# Patient Record
Sex: Female | Born: 1955 | Race: Black or African American | Hispanic: No | Marital: Married | Smoking: Current every day smoker
Health system: Southern US, Community
[De-identification: ages and names within clinical notes are randomized; demographics above are authoritative.]

## PROBLEM LIST (undated history)

## (undated) DIAGNOSIS — I10 Essential (primary) hypertension: Secondary | ICD-10-CM

## (undated) DIAGNOSIS — E119 Type 2 diabetes mellitus without complications: Secondary | ICD-10-CM

## (undated) DIAGNOSIS — E785 Hyperlipidemia, unspecified: Secondary | ICD-10-CM

## (undated) HISTORY — DX: Essential (primary) hypertension: I10

## (undated) HISTORY — DX: Hyperlipidemia, unspecified: E78.5

## (undated) HISTORY — DX: Type 2 diabetes mellitus without complications: E11.9

---

## 2005-08-26 ENCOUNTER — Emergency Department (HOSPITAL_COMMUNITY): Admission: EM | Admit: 2005-08-26 | Discharge: 2005-08-27 | Payer: Self-pay | Admitting: Emergency Medicine

## 2005-08-29 ENCOUNTER — Inpatient Hospital Stay (HOSPITAL_COMMUNITY): Admission: AD | Admit: 2005-08-29 | Discharge: 2005-08-31 | Payer: Self-pay | Admitting: Pulmonary Disease

## 2008-08-30 ENCOUNTER — Emergency Department (HOSPITAL_COMMUNITY): Admission: EM | Admit: 2008-08-30 | Discharge: 2008-08-30 | Payer: Self-pay | Admitting: Emergency Medicine

## 2009-01-27 ENCOUNTER — Emergency Department (HOSPITAL_COMMUNITY): Admission: EM | Admit: 2009-01-27 | Discharge: 2009-01-27 | Payer: Self-pay | Admitting: Emergency Medicine

## 2010-02-13 ENCOUNTER — Emergency Department (HOSPITAL_COMMUNITY)
Admission: EM | Admit: 2010-02-13 | Discharge: 2010-02-13 | Payer: Self-pay | Source: Home / Self Care | Admitting: Emergency Medicine

## 2010-08-03 NOTE — Discharge Summary (Signed)
NAME:  Anna Hale, Anna Hale           ACCOUNT NO.:  [id number]   MEDICAL RECORD NO.:  [id number]          PATIENT TYPE:  INP   LOCATION:  A303                          FACILITY:  [facility]   PHYSICIAN:  Edward L. Juanetta Gosling, M.D.DATE OF BIRTH:  01/22/1956   DATE OF ADMISSION:  08/29/2005  DATE OF DISCHARGE:  06/16/2007LH                                 DISCHARGE SUMMARY   FINAL DISCHARGE DIAGNOSIS:  Escherichia coli pyelonephritis.   [name] is a 55 year old who came to my office because of flank pain and  fever.  She was in her usual state of good health at home when this  occurred.  She had actually been to the emergency room, was diagnosed with  an upper respiratory infection, and was discharged on Zithromax.  Since then  she has had a lot of flank pain.  She is still febrile. She is still having  a lot of weakness and flank pain.  She has not really got a lot of urinary  symptoms.  Her urine very positive and was very positive in the emergency  room.  However, as mentioned, it was felt at that time that she probably had  an upper respiratory infection and she was discharged on Zithromax.  When  she was seen in my office, she was sweaty, her temperature was 102.  Her  chest was clear.  She had marked right flank pain suggesting pyelonephritis.   HOSPITAL COURSE:  She was started on IV antibiotics and showed improvement  rapidly and she was discharged home on June 16 on Levaquin 500 mg daily x7  more days.  She is going to follow up in my office.      Edward L. Juanetta Gosling, M.D.  Electronically Signed     ELH/MEDQ  D:  08/31/2005  T:  08/31/2005  Job:  [id number]

## 2010-08-03 NOTE — H&P (Signed)
NAME:  Anna Hale, Anna Hale           ACCOUNT NO.:  [id number]   MEDICAL RECORD NO.:  [id number]          PATIENT TYPE:  INP   LOCATION:  A303                          FACILITY:  [facility]   PHYSICIAN:  Edward L. Juanetta Gosling, M.D.DATE OF BIRTH:  Jul 25, 1955   DATE OF ADMISSION:  08/29/2005  DATE OF DISCHARGE:  LH                                HISTORY & PHYSICAL   Ms. Roesler is a 55 year old, whom I have not seen before, but I see her  father.  She had been in her usual state of good health at home, on no  medications, with no known medical problem when she developed fever.  She  said that she is having some pain in her side.  She became febrile and  actually went to the emergency room on the 12th.  She was diagnosed as  having bronchitis, placed on Zithromax and started her on that but has not  improved.  She says she has noted that she is having a lot more pain in the  right side.  She is having a lot of discomfort.  She is having sweats.  She  has had fever to about 102.  She denies any urinary symptom.  She is not  having a lot of chest symptoms; she mostly has the pain.  She has had some  chills.  She has had, as mentioned, no urinary symptoms.   PAST MEDICAL HISTORY:  Essentially negative.   MEDICATIONS:   ALLERGIES:  COMPAZINE.   She has never had a similar illness in the past.   SOCIAL HISTORY:  She smokes.  She does not drink any alcohol.  She does not  use any other drugs.  She actually works in the school system.  She has been  providing care for her father who has multiple chronic medical illnesses.  She is on no chronic medications.  She is allergic to Compazine which causes  her to develop extra pyramidal symptoms.   REVIEW OF SYSTEMS:  Except as mentioned, she has had no sore throat.  She  has had fever and chills.  She has had no cough.  She has not had any  symptoms of ear pain, etc.  Her exam in my office showed that she was  sweating and appeared to be in acute  distress.  Her blood pressure was  120/70, pulse was 100.  Temperature was actually was only in the 90s, but  she said it had been much higher earlier.  Her pupils were reactive.  Her  extraocular muscles were intact.  Tympanic membranes were clear.  Her throat  was clear without any inflammatory changes.  She did not have any  adenopathy.  Her chest shows some decreased breath sounds on the right.  The  left was clear __________regular without gallop.  Abdomen is soft, no masses  felt.  Extremities showed no edema.  She had marked disk percussion  tenderness in the left flank.   Laboratory work from the emergency room showed that her urine was very  positive, and I think she probably has pyelonephritis based on her exam,  clinical history,  and urinalysis.  So I am going to ahead and admit her to  the hospital and treat as if this pyelonephritis with Levaquin.  Otherwise,  I am going to stop the Z-Pak.  I do not think it is helping her.  She is  going to have __________standing orders, and she will follow from there.      Edward L. Juanetta Gosling, M.D.  Electronically Signed     ELH/MEDQ  D:  08/29/2005  T:  08/29/2005  Job:  [id number]

## 2010-08-03 NOTE — Group Therapy Note (Signed)
NAME:  Anna Hale, Anna Hale           ACCOUNT NO.:  [id number]   MEDICAL RECORD NO.:  [id number]          PATIENT TYPE:  INP   LOCATION:  A303                          FACILITY:  [facility]   PHYSICIAN:  Edward L. Juanetta Gosling, M.D.DATE OF BIRTH:  Jun 06, 1955   DATE OF PROCEDURE:  08/31/2005  DATE OF DISCHARGE:  08/31/2005                                   PROGRESS NOTE   Ms. Scales says she feels great and wants to go home.  She has no  complaints.   She had been afebrile since yesterday temperature is 98.5, pulse 80,  respirations 22, blood pressure 112/62.  Her flank is much less tender.  Her  chest is clear.  She looks much more comfortable.   ASSESSMENT:  She is better.   PLAN:  To discharge home today.  Please see discharge summary for details.      Edward L. Juanetta Gosling, M.D.  Electronically Signed     ELH/MEDQ  D:  08/31/2005  T:  08/31/2005  Job:  [id number]

## 2010-08-03 NOTE — Group Therapy Note (Signed)
NAME:  Anna Hale, Anna Hale           ACCOUNT NO.:  [id number]   MEDICAL RECORD NO.:  [id number]          PATIENT TYPE:  INP   LOCATION:  A303                          FACILITY:  [facility]   PHYSICIAN:  Edward L. Juanetta Gosling, M.D.DATE OF BIRTH:  [birth year]   DATE OF PROCEDURE:  08/30/2005  DATE OF DISCHARGE:  08/31/2005                                   PROGRESS NOTE   Ms. Wussow says she feels better, but still does not feel well.  She is  still quite painful in her flank; and she says she just feels washed out.  She has no other new complaints.   OBJECTIVE:  Her exam shows her temperature was as high as 102.5; today it is  100.4.  Blood pressure 111/58, respirations 20, pulse 86.   ASSESSMENT:  She has got pyelonephritis.   PLAN:  Is to continue her IV antibiotics and reevaluate tomorrow.      Edward L. Juanetta Gosling, M.D.  Electronically Signed     ELH/MEDQ  D:  08/31/2005  T:  08/31/2005  Job:  [id number]

## 2011-05-04 ENCOUNTER — Emergency Department (HOSPITAL_COMMUNITY)
Admission: EM | Admit: 2011-05-04 | Discharge: 2011-05-04 | Disposition: A | Discharge status: Home / Self Care | Payer: BC Managed Care – PPO | Attending: Emergency Medicine | Admitting: Emergency Medicine

## 2011-05-04 ENCOUNTER — Encounter (HOSPITAL_COMMUNITY): Payer: Self-pay | Admitting: *Deleted

## 2011-05-04 DIAGNOSIS — J329 Chronic sinusitis, unspecified: Secondary | ICD-10-CM | POA: Insufficient documentation

## 2011-05-04 DIAGNOSIS — F17200 Nicotine dependence, unspecified, uncomplicated: Secondary | ICD-10-CM | POA: Insufficient documentation

## 2011-05-04 DIAGNOSIS — R220 Localized swelling, mass and lump, head: Secondary | ICD-10-CM | POA: Insufficient documentation

## 2011-05-04 DIAGNOSIS — R05 Cough: Secondary | ICD-10-CM

## 2011-05-04 DIAGNOSIS — R070 Pain in throat: Secondary | ICD-10-CM | POA: Insufficient documentation

## 2011-05-04 DIAGNOSIS — R6889 Other general symptoms and signs: Secondary | ICD-10-CM | POA: Insufficient documentation

## 2011-05-04 DIAGNOSIS — R51 Headache: Secondary | ICD-10-CM | POA: Insufficient documentation

## 2011-05-04 DIAGNOSIS — J3489 Other specified disorders of nose and nasal sinuses: Secondary | ICD-10-CM | POA: Insufficient documentation

## 2011-05-04 DIAGNOSIS — R059 Cough, unspecified: Secondary | ICD-10-CM | POA: Insufficient documentation

## 2011-05-04 MED ORDER — AZITHROMYCIN 250 MG PO TABS: ORAL_TABLET | ORAL | Status: DC

## 2011-05-04 MED ORDER — HYDROCOD POLST-CHLORPHEN POLST 10-8 MG/5ML PO LQCR: 5.0000 mL | Freq: Two times a day (BID) | ORAL | Status: DC | PRN

## 2011-05-04 NOTE — Discharge Instructions (Signed)

## 2011-05-04 NOTE — ED Notes (Signed)
Pt c/o nasal congestion and headache since Thursday. Pt states that her drainage is clear.

## 2011-05-04 NOTE — ED Notes (Signed)
Pt a/ox4. resp even and unlabored. NAD at this time. D/C instructions reviewed with pt. Pt verbalized understanding. Pt ambulated to lobby with steady gate.  

## 2011-05-04 NOTE — ED Notes (Signed)
Pt presents with cough and nasal congestion since Thursday. NAD at this time.

## 2011-05-04 NOTE — ED Provider Notes (Signed)
History     CSN: [id number]  Arrival date & time 05/04/11  1205   First MD Initiated Contact with Patient 05/04/11 1229      Chief Complaint  Patient presents with  . Nasal Congestion    (Consider location/radiation/quality/duration/timing/severity/associated sxs/prior treatment) HPI Comments: Patient complains of nasal congestion, runny nose, cough, and headache for 2 days. She also complains of sinus pressure. She denies any chest pain, shortness of breath, fever, or vomiting.  She states that her cough is occasionally productive of yellow sputum but mostly clear.  She states she has taken over-the-counter cold medications without improvement.  Patient is a 56 y.o. female presenting with URI. The history is provided by the patient. No language interpreter was used.  URI The primary symptoms include headaches, sore throat and cough. Primary symptoms do not include fever, fatigue, ear pain, swollen glands, wheezing, abdominal pain, nausea, vomiting, myalgias, arthralgias or rash. The current episode started 2 days ago. This is a new problem. The problem has not changed since onset. The headache is not associated with neck stiffness or weakness.   The sore throat is not accompanied by trouble swallowing.  The cough began 2 days ago. The cough is new. The cough is productive. The sputum is white.  Symptoms associated with the illness include sinus pressure, congestion and rhinorrhea. The illness is not associated with chills or facial pain.    History reviewed. No pertinent past medical history.  Past Surgical History  Procedure Date  . Cesarean section     History reviewed. No pertinent family history.  History  Substance Use Topics  . Smoking status: Current Everyday Smoker -- 1.0 packs/day    Types: Cigarettes  . Smokeless tobacco: Not on file  . Alcohol Use: No    OB History    Grav Para Term Preterm Abortions TAB SAB Ect Mult Living                  Review of  Systems  Constitutional: Negative for fever, chills, activity change, appetite change and fatigue.  HENT: Positive for congestion, sore throat, rhinorrhea, sneezing and sinus pressure. Negative for ear pain, trouble swallowing, neck pain and neck stiffness.   Respiratory: Positive for cough. Negative for chest tightness, shortness of breath and wheezing.   Cardiovascular: Negative for chest pain and palpitations.  Gastrointestinal: Negative for nausea, vomiting, abdominal pain and blood in stool.  Genitourinary: Negative for dysuria, hematuria, flank pain and difficulty urinating.  Musculoskeletal: Negative for myalgias, back pain and arthralgias.  Skin: Negative for rash.  Neurological: Positive for headaches. Negative for dizziness, weakness and numbness.  Hematological: Does not bruise/bleed easily.  All other systems reviewed and are negative.    Allergies  Compazine  Home Medications  No current outpatient prescriptions on file.  BP 142/79  Pulse 82  Temp(Src) 98.7 F (37.1 C) (Oral)  Resp 18  Ht 5\' 5"  (1.651 m)  Wt 206 lb 1 oz (93.469 kg)  BMI 34.29 kg/m2  SpO2 98%  Physical Exam  Nursing note and vitals reviewed. Constitutional: She is oriented to person, place, and time. She appears well-developed and well-nourished. No distress.  HENT:  Head: Normocephalic and atraumatic.  Right Ear: Tympanic membrane and ear canal normal.  Left Ear: Tympanic membrane and ear canal normal.  Nose: Mucosal edema and rhinorrhea present. Right sinus exhibits maxillary sinus tenderness and frontal sinus tenderness. Left sinus exhibits maxillary sinus tenderness and frontal sinus tenderness.  Mouth/Throat: Uvula is midline, oropharynx is  clear and moist and mucous membranes are normal.  Eyes: EOM are normal. Pupils are equal, round, and reactive to light.  Neck: Normal range of motion. Neck supple.  Cardiovascular: Normal rate, regular rhythm and normal heart sounds.   Pulmonary/Chest:  Effort normal and breath sounds normal. No respiratory distress. She has no wheezes. She has no rales. She exhibits no tenderness.  Abdominal: Soft. She exhibits no distension. There is no tenderness.  Musculoskeletal: Normal range of motion. She exhibits no tenderness.  Lymphadenopathy:    She has no cervical adenopathy.  Neurological: She is alert and oriented to person, place, and time. No cranial nerve deficit. She exhibits normal muscle tone. Coordination normal.  Skin: Skin is warm and dry.    ED Course  Procedures (including critical care time)       MDM    Patient is alert no acute distress. Vitals are stable.  Non-toxic appearing, Lung sounds are clear auscultation bilaterally.  Mild to moderate nasal congestion and mucosal edema.  Tenderness to palpation of the bilateral frontal sinuses.    Patient / Family / Caregiver understand and agree with initial ED impression and plan with expectations set for ED visit. Pt feels improved after observation and/or treatment in ED.   Glenard Keesling L. [place], [place] 05/05/11 [phone]

## 2011-05-07 NOTE — ED Provider Notes (Signed)
Medical screening examination/treatment/procedure(s) were performed by non-physician practitioner and as supervising physician I was immediately available for consultation/collaboration.  Nicoletta Dress. Colon Branch, MD 05/07/11 1530

## 2012-10-08 ENCOUNTER — Encounter (HOSPITAL_COMMUNITY): Payer: Self-pay

## 2012-10-08 ENCOUNTER — Emergency Department (HOSPITAL_COMMUNITY)
Admission: EM | Admit: 2012-10-08 | Discharge: 2012-10-08 | Disposition: A | Discharge status: Home / Self Care | Payer: BC Managed Care – PPO | Attending: Emergency Medicine | Admitting: Emergency Medicine

## 2012-10-08 DIAGNOSIS — S4980XA Other specified injuries of shoulder and upper arm, unspecified arm, initial encounter: Secondary | ICD-10-CM | POA: Insufficient documentation

## 2012-10-08 DIAGNOSIS — Y92410 Unspecified street and highway as the place of occurrence of the external cause: Secondary | ICD-10-CM | POA: Insufficient documentation

## 2012-10-08 DIAGNOSIS — S99929A Unspecified injury of unspecified foot, initial encounter: Secondary | ICD-10-CM | POA: Insufficient documentation

## 2012-10-08 DIAGNOSIS — S46909A Unspecified injury of unspecified muscle, fascia and tendon at shoulder and upper arm level, unspecified arm, initial encounter: Secondary | ICD-10-CM | POA: Insufficient documentation

## 2012-10-08 DIAGNOSIS — IMO0002 Reserved for concepts with insufficient information to code with codable children: Secondary | ICD-10-CM | POA: Insufficient documentation

## 2012-10-08 DIAGNOSIS — S8990XA Unspecified injury of unspecified lower leg, initial encounter: Secondary | ICD-10-CM | POA: Insufficient documentation

## 2012-10-08 DIAGNOSIS — Y9389 Activity, other specified: Secondary | ICD-10-CM | POA: Insufficient documentation

## 2012-10-08 DIAGNOSIS — F17200 Nicotine dependence, unspecified, uncomplicated: Secondary | ICD-10-CM | POA: Insufficient documentation
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
## 2012-10-08 NOTE — ED Notes (Signed)
nad noted prior to dc dc instructions reviewed with pt and explained and voiced understanding.
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
## 2014-02-20 ENCOUNTER — Encounter (HOSPITAL_COMMUNITY): Payer: Self-pay | Admitting: Emergency Medicine

## 2014-02-20 ENCOUNTER — Emergency Department (HOSPITAL_COMMUNITY)
Admission: EM | Admit: 2014-02-20 | Discharge: 2014-02-20 | Disposition: A | Discharge status: Home / Self Care | Payer: BC Managed Care – PPO | Attending: Emergency Medicine | Admitting: Emergency Medicine

## 2014-02-20 DIAGNOSIS — J039 Acute tonsillitis, unspecified: Secondary | ICD-10-CM | POA: Insufficient documentation

## 2014-02-20 DIAGNOSIS — Z720 Tobacco use: Secondary | ICD-10-CM | POA: Insufficient documentation

## 2014-02-20 DIAGNOSIS — J029 Acute pharyngitis, unspecified: Secondary | ICD-10-CM | POA: Diagnosis present

## 2014-02-20 LAB — RAPID STREP SCREEN (MED CTR MEBANE ONLY): Streptococcus, Group A Screen (Direct): NEGATIVE

## 2014-02-20 MED ORDER — AMOXICILLIN-POT CLAVULANATE 875-125 MG PO TABS: 1.0000 | ORAL_TABLET | Freq: Two times a day (BID) | ORAL | Status: DC

## 2014-02-20 MED ORDER — AMOXICILLIN-POT CLAVULANATE 875-125 MG PO TABS
1.0000 | ORAL_TABLET | Freq: Two times a day (BID) | ORAL | Status: DC
  Administered 2014-02-20: 1 via ORAL
  Filled 2014-02-20: qty 1

## 2014-02-20 NOTE — Progress Notes (Signed)
ED/CM noted patient did not have health insurance and/or PCP listed in the computer.  Patient was given the [facility] resources handout with information on the clinics, food pantries, and the handout for new health insurance sign-up. Provided drug discount card. Patient expressed appreciation for information received.

## 2014-02-20 NOTE — ED Provider Notes (Signed)
CSN: [id number]     Arrival date & time 02/20/14  1207 History  This chart was scribed for Nat Christen, MD by Jeanell Sparrow, ED Scribe. This patient was seen in room APA05/APA05 and the patient's care was started at 1:54 PM.    Chief Complaint  Patient presents with  . Sore Throat   The history is provided by the patient. No language interpreter was used.   HPI Comments: Anna Hale is a 58 y.o. female who presents to the Emergency Department complaining of a sore throat that started 2 days ago. She reports that she had some sinus pressure also. She reports no treatment PTA. She denies any cough, stiff neck.  Severity is moderate. She is able to swallow.   History reviewed. No pertinent past medical history. Past Surgical History  Procedure Laterality Date  . Cesarean section     History reviewed. No pertinent family history. History  Substance Use Topics  . Smoking status: Current Every Day Smoker -- 1.00 packs/day    Types: Cigarettes  . Smokeless tobacco: Not on file  . Alcohol Use: No   OB History    No data available     Review of Systems  Constitutional: Negative for fever.  HENT: Positive for sinus pressure and sore throat.   All other systems reviewed and are negative.   Allergies  Compazine  Home Medications   Prior to Admission medications   Medication Sig Start Date End Date Taking? Authorizing Provider  amoxicillin-clavulanate (AUGMENTIN) 875-125 MG per tablet Take 1 tablet by mouth 2 (two) times daily. One po bid x 7 days 02/20/14   Nat Christen, MD  azithromycin (ZITHROMAX Z-PAK) 250 MG tablet Take two tablets on day one, then one tab qd days 2-5 Patient not taking: Reported on 02/20/2014 05/04/11   Tammy L. Triplett, PA-C  chlorpheniramine-HYDROcodone (TUSSIONEX PENNKINETIC ER) 10-8 MG/5ML LQCR Take 5 mLs by mouth every 12 (twelve) hours as needed. Patient not taking: Reported on 02/20/2014 05/04/11   Tammy L. Triplett, PA-C   BP 157/78 mmHg  Pulse 105   Temp(Src) 100.9 F (38.3 C) (Oral)  Resp 18  Ht 5\' 8"  (1.727 m)  Wt 200 lb (90.719 kg)  BMI 30.42 kg/m2  SpO2 100% Physical Exam  Constitutional: She is oriented to person, place, and time. She appears well-developed and well-nourished.  HENT:  Head: Normocephalic and atraumatic.  Some tonsillar erythema and edema. White patches on right tonsil.   Eyes: Conjunctivae and EOM are normal. Pupils are equal, round, and reactive to light.  Neck: Normal range of motion. Neck supple.  Cardiovascular: Normal rate, regular rhythm and normal heart sounds.   Pulmonary/Chest: Effort normal and breath sounds normal.  Abdominal: Soft. Bowel sounds are normal.  Musculoskeletal: Normal range of motion.  Neurological: She is alert and oriented to person, place, and time.  Skin: Skin is warm and dry.  Psychiatric: She has a normal mood and affect. Her behavior is normal.  Nursing note and vitals reviewed.   ED Course  Procedures (including critical care time) DIAGNOSTIC STUDIES: Oxygen Saturation is 100% on RA, normal by my interpretation.    COORDINATION OF CARE: 1:58 PM- Pt advised of plan for treatment which includes Augmentin and a strep test and pt agrees.  Labs Review Labs Reviewed  RAPID STREP SCREEN  CULTURE, GROUP A STREP    Imaging Review No results found.   EKG Interpretation None      MDM   Final diagnoses:  Tonsillitis  No evidence of peritonsillar abscess. However she does have exudative tonsillitis. Rx Augmentin 875/125 twice a day.  I personally performed the services described in this documentation, which was scribed in my presence. The recorded information has been reviewed and is accurate.     Nat Christen, MD 02/20/14 1515

## 2014-02-20 NOTE — ED Notes (Signed)
Pt reports sinus pressure and sore throat since Friday. nad noted. Pt denies any known fever.

## 2014-02-20 NOTE — Discharge Instructions (Signed)
Antibiotic twice a day. Gargle with salt water. Increase fluids. Tylenol or ibuprofen for pain.

## 2014-02-22 LAB — CULTURE, GROUP A STREP

## 2014-02-23 ENCOUNTER — Telehealth (HOSPITAL_COMMUNITY): Payer: Self-pay

## 2014-02-23 NOTE — Telephone Encounter (Signed)
Post ED Visit - Positive Culture Follow-up  Culture report reviewed by antimicrobial stewardship pharmacist: []  Wes Dulaney, Pharm.D., BCPS [x]  Heide Guile, Pharm.D., BCPS []  Alycia Rossetti, Pharm.D., BCPS []  [place], Pharm.D., BCPS, AAHIVP []  Legrand Como, Pharm.D., BCPS, AAHIVP []  Elicia Lamp, Pharm.D.   Positive Throat culture, Group A Strep Treated with Amoxicillin, organism sensitive to the same and no further patient follow-up is required at this time.  Dortha Kern 02/23/2014, 4:49 PM

## 2015-09-07 ENCOUNTER — Emergency Department (HOSPITAL_COMMUNITY)
Admission: EM | Admit: 2015-09-07 | Discharge: 2015-09-07 | Disposition: A | Discharge status: Home / Self Care | Payer: BC Managed Care – PPO | Attending: Emergency Medicine | Admitting: Emergency Medicine

## 2015-09-07 ENCOUNTER — Encounter (HOSPITAL_COMMUNITY): Payer: Self-pay | Admitting: *Deleted

## 2015-09-07 DIAGNOSIS — M545 Low back pain, unspecified: Secondary | ICD-10-CM

## 2015-09-07 DIAGNOSIS — F17210 Nicotine dependence, cigarettes, uncomplicated: Secondary | ICD-10-CM | POA: Diagnosis not present

## 2015-09-07 DIAGNOSIS — I10 Essential (primary) hypertension: Secondary | ICD-10-CM | POA: Diagnosis not present

## 2015-09-07 DIAGNOSIS — R739 Hyperglycemia, unspecified: Secondary | ICD-10-CM | POA: Insufficient documentation

## 2015-09-07 DIAGNOSIS — R7989 Other specified abnormal findings of blood chemistry: Secondary | ICD-10-CM

## 2015-09-07 LAB — I-STAT CHEM 8, ED
BUN: 20 mg/dL (ref 6–20)
CREATININE: 1.2 mg/dL — AB (ref 0.44–1.00)
Calcium, Ion: 1.11 mmol/L — ABNORMAL LOW (ref 1.13–1.30)
Chloride: 106 mmol/L (ref 101–111)
Glucose, Bld: 181 mg/dL — ABNORMAL HIGH (ref 65–99)
HEMATOCRIT: 43 % (ref 36.0–46.0)
HEMOGLOBIN: 14.6 g/dL (ref 12.0–15.0)
POTASSIUM: 4.4 mmol/L (ref 3.5–5.1)
SODIUM: 140 mmol/L (ref 135–145)
TCO2: 26 mmol/L (ref 0–100)

## 2015-09-07 LAB — URINALYSIS, ROUTINE W REFLEX MICROSCOPIC
Bilirubin Urine: NEGATIVE
GLUCOSE, UA: NEGATIVE mg/dL
Ketones, ur: NEGATIVE mg/dL
LEUKOCYTES UA: NEGATIVE
NITRITE: NEGATIVE
PH: 5.5 (ref 5.0–8.0)
Protein, ur: NEGATIVE mg/dL

## 2015-09-07 LAB — URINE MICROSCOPIC-ADD ON

## 2015-09-07 MED ORDER — HYDROCHLOROTHIAZIDE 25 MG PO TABS: 25.0000 mg | ORAL_TABLET | Freq: Every day | ORAL | Status: DC

## 2015-09-07 MED ORDER — TRAMADOL HCL 50 MG PO TABS: 50.0000 mg | ORAL_TABLET | Freq: Four times a day (QID) | ORAL | Status: DC | PRN

## 2015-09-07 NOTE — ED Notes (Signed)
Pt reports lower back pain and states it feels the same way as when she had a uti. Pt denies dysuria and urinary frequency but states "I'm trying to catch it before it gets bad."

## 2015-09-07 NOTE — ED Provider Notes (Signed)
CSN: [id number]     Arrival date & time 09/07/15  2057 History   First MD Initiated Contact with Patient 09/07/15 2210     Chief Complaint  Patient presents with  . Back Pain     (Consider location/radiation/quality/duration/timing/severity/associated sxs/prior Treatment) HPI  This is a 60 year old female who presents emergency Department with chief complaint of left lower back pain. Patient states that she has had several days ago. Then her left lower back which is worse with twisting. Patient was concerned she might be getting a urinary tract infection. She is found to be extremely hypertensive at triage and denies a previous history of this diagnosis. She does not have a current primary care physician. She denies any urinary symptoms, fevers, chills, nausea, vomiting.Denies weakness, loss of bowel/bladder function or saddle anesthesia. Denies neck stiffness, headache, rash.  Denies fever or recent procedures to back.   History reviewed. No pertinent past medical history. Past Surgical History  Procedure Laterality Date  . Cesarean section     No family history on file. Social History  Substance Use Topics  . Smoking status: Current Every Day Smoker -- 1.00 packs/day    Types: Cigarettes  . Smokeless tobacco: None  . Alcohol Use: No   OB History    No data available     Review of Systems Ten systems reviewed and are negative for acute change, except as noted in the HPI.     Allergies  Compazine  Home Medications   Prior to Admission medications   Medication Sig Start Date End Date Taking? Authorizing Provider  amoxicillin-clavulanate (AUGMENTIN) 875-125 MG per tablet Take 1 tablet by mouth 2 (two) times daily. One po bid x 7 days 02/20/14   Nat Christen, MD  azithromycin (ZITHROMAX Z-PAK) 250 MG tablet Take two tablets on day one, then one tab qd days 2-5 Patient not taking: Reported on 02/20/2014 05/04/11   Tammy Triplett, PA-C  chlorpheniramine-HYDROcodone (TUSSIONEX  PENNKINETIC ER) 10-8 MG/5ML LQCR Take 5 mLs by mouth every 12 (twelve) hours as needed. Patient not taking: Reported on 02/20/2014 05/04/11   Tammy Triplett, PA-C   BP 229/91 mmHg  Pulse 92  Temp(Src) 98.8 F (37.1 C) (Oral)  Resp 20  Ht 5\' 6"  (1.676 m)  Wt 97.297 kg  BMI 34.64 kg/m2  SpO2 100% Physical Exam  Constitutional: She is oriented to person, place, and time. She appears well-developed and well-nourished. No distress.  HENT:  Head: Normocephalic and atraumatic.  Eyes: Conjunctivae are normal. No scleral icterus.  Neck: Normal range of motion.  Cardiovascular: Normal rate, regular rhythm and normal heart sounds.  Exam reveals no gallop and no friction rub.   No murmur heard. Pulmonary/Chest: Effort normal and breath sounds normal. No respiratory distress.  Abdominal: Soft. Bowel sounds are normal. She exhibits no distension and no mass. There is no tenderness. There is no guarding.  Musculoskeletal: Normal range of motion.       Lumbar back: She exhibits tenderness and spasm.       Back:  Neurological: She is alert and oriented to person, place, and time.  Skin: Skin is warm and dry. She is not diaphoretic.  Nursing note and vitals reviewed.   ED Course  Procedures (including critical care time) Labs Review Labs Reviewed  URINALYSIS, ROUTINE W REFLEX MICROSCOPIC (NOT AT [facility]) - Abnormal; Notable for the following:    Specific Gravity, Urine >1.030 (*)    Hgb urine dipstick SMALL (*)    All other components within  normal limits  URINE MICROSCOPIC-ADD ON - Abnormal; Notable for the following:    Squamous Epithelial / LPF 6-30 (*)    Bacteria, UA RARE (*)    All other components within normal limits    Imaging Review No results found. I have personally reviewed and evaluated these images and lab results as part of my medical decision-making.   EKG Interpretation None      MDM   Final diagnoses:  Left-sided low back pain without sciatica  Essential  hypertension  Hyperglycemia  Elevated serum creatinine    Review of chart shows  BP 212/90 back in 2014. She was clearly had hypertension for some time. Her urine is negative. Creatinine is 1.2 which is slightly elevated, but given the long duration of her hypertension. I suspect this is her normal. Advised patient not to take any NSAIDs. Patient will be given tramadol for pain. She will also be started on a single agent of HCTZ 25 mg and is advised to find a primary care physician immediately. Given the fact that she has poorly controlled and very high, hypertension, elevated serum creatinine and hyperglycemia. Patient is also advised to quit smoking. Discussed return precautions with the patient.Patient with back pain.  No neurological deficits and normal neuro exam.  Patient can walk but states is painful.  No loss of bowel or bladder control.  No concern for cauda equina.  No fever, night sweats, weight loss, h/o cancer, IVDU.  RICE protocol and pain medicine indicated and discussed with patient.      Margarita Mail, PA-C 09/08/15 0131  Dorie Rank, MD 09/11/15 1058

## 2015-09-07 NOTE — Discharge Instructions (Signed)
Your blood pressure is TOO HIGH and needs to be managed by a primary care doctor right away. Your kidneys appear to be affected by the blood pressure already and over time this can lead to kidney disease or failure. Your creatine (shich measures kidney functions) was elevated and needs to be rechecked in a week or so. Your blood sugar is also too high. NONE of this appears to be related to your back pain.  I am starting you on HCTZ for your blood pressure. THis is only a small start and is not enough to manage your blood pressure. You will need to take blood pressure medicine every month and will likely need more than one medicine. This is only a bandaid for the time being and you will need further management with a primary care doctor.  IF YOU DO NOT HAVE A PRIMARY CARE DOCTOR YOU NEED TO HAVE ONE RIGHT AWAY!!!!  SEEK IMMEDIATE MEDICAL ATTENTION IF: New numbness, tingling, weakness, or problem with the use of your arms or legs.  Severe back pain not relieved with medications.  Change in bowel or bladder control.  Increasing pain in any areas of the body (such as chest or abdominal pain).  Shortness of breath, dizziness or fainting.  Nausea (feeling sick to your stomach), vomiting, fever, or sweats.   Managing Your High Blood Pressure Blood pressure is a measurement of how forceful your blood is pressing against the walls of the arteries. Arteries are muscular tubes within the circulatory system. Blood pressure does not stay the same. Blood pressure rises when you are active, excited, or nervous; and it lowers during sleep and relaxation. If the numbers measuring your blood pressure stay above normal most of the time, you are at risk for health problems. High blood pressure (hypertension) is a long-term (chronic) condition in which blood pressure is elevated. A blood pressure reading is recorded as two numbers, such as 120 over 80 (or 120/80). The first, higher number is called the systolic  pressure. It is a measure of the pressure in your arteries as the heart beats. The second, lower number is called the diastolic pressure. It is a measure of the pressure in your arteries as the heart relaxes between beats.  Keeping your blood pressure in a normal range is important to your overall health and prevention of health problems, such as heart disease and stroke. When your blood pressure is uncontrolled, your heart has to work harder than normal. High blood pressure is a very common condition in adults because blood pressure tends to rise with age. Men and women are equally likely to have hypertension but at different times in life. Before age 48, men are more likely to have hypertension. After 60 years of age, women are more likely to have it. Hypertension is especially common in African Americans. This condition often has no signs or symptoms. The cause of the condition is usually not known. Your caregiver can help you come up with a plan to keep your blood pressure in a normal, healthy range. BLOOD PRESSURE STAGES Blood pressure is classified into four stages: normal, prehypertension, stage 1, and stage 2. Your blood pressure reading will be used to determine what type of treatment, if any, is necessary. Appropriate treatment options are tied to these four stages:  Normal  Systolic pressure (mm Hg): below 120.  Diastolic pressure (mm Hg): below 80. Prehypertension  Systolic pressure (mm Hg): 120 to 139.  Diastolic pressure (mm Hg): 80 to 89. Stage1  Systolic pressure (mm Hg): 140 to 159.  Diastolic pressure (mm Hg): 90 to 99. Stage2  Systolic pressure (mm Hg): 160 or above.  Diastolic pressure (mm Hg): 100 or above. RISKS RELATED TO HIGH BLOOD PRESSURE Managing your blood pressure is an important responsibility. Uncontrolled high blood pressure can lead to:  A heart attack.  A stroke.  A weakened blood vessel (aneurysm).  Heart failure.  Kidney damage.  Eye  damage.  Metabolic syndrome.  Memory and concentration problems. HOW TO MANAGE YOUR BLOOD PRESSURE Blood pressure can be managed effectively with lifestyle changes and medicines (if needed). Your caregiver will help you come up with a plan to bring your blood pressure within a normal range. Your plan should include the following: Education  Read all information provided by your caregivers about how to control blood pressure.  Educate yourself on the latest guidelines and treatment recommendations. New research is always being done to further define the risks and treatments for high blood pressure. Lifestylechanges  Control your weight.  Avoid smoking.  Stay physically active.  Reduce the amount of salt in your diet.  Reduce stress.  Control any chronic conditions, such as high cholesterol or diabetes.  Reduce your alcohol intake. Medicines  Several medicines (antihypertensive medicines) are available, if needed, to bring blood pressure within a normal range. Communication  Review all the medicines you take with your caregiver because there may be side effects or interactions.  Talk with your caregiver about your diet, exercise habits, and other lifestyle factors that may be contributing to high blood pressure.  See your caregiver regularly. Your caregiver can help you create and adjust your plan for managing high blood pressure. RECOMMENDATIONS FOR TREATMENT AND FOLLOW-UP  The following recommendations are based on current guidelines for managing high blood pressure in nonpregnant adults. Use these recommendations to identify the proper follow-up period or treatment option based on your blood pressure reading. You can discuss these options with your caregiver.  Systolic pressure of 123456 to [id number] or diastolic pressure of 80 to 89: Follow up with your caregiver as directed.  Systolic pressure of [id number] to 0000000 or diastolic pressure of 90 to 100: Follow up with your caregiver within  2 months.  Systolic pressure above 0000000 or diastolic pressure above [id number]: Follow up with your caregiver within 1 month.  Systolic pressure above [id number] or diastolic pressure above [id number]: Consider antihypertensive therapy; follow up with your caregiver within 1 week.  Systolic pressure above [id number] or diastolic pressure above 123456: Begin antihypertensive therapy; follow up with your caregiver within 1 week.   This information is not intended to replace advice given to you by your health care provider. Make sure you discuss any questions you have with your health care provider.   Document Released: 11/27/2011 Document Reviewed: 11/27/2011 Elsevier Interactive Patient Education 2016 [employer].  Hypertension Hypertension, commonly called high blood pressure, is when the force of blood pumping through your arteries is too strong. Your arteries are the blood vessels that carry blood from your heart throughout your body. A blood pressure reading consists of a higher number over a lower number, such as 110/72. The higher number (systolic) is the pressure inside your arteries when your heart pumps. The lower number (diastolic) is the pressure inside your arteries when your heart relaxes. Ideally you want your blood pressure below 120/80. Hypertension forces your heart to work harder to pump blood. Your arteries may become narrow or stiff. Having untreated or uncontrolled hypertension can cause heart  attack, stroke, kidney disease, and other problems. RISK FACTORS Some risk factors for high blood pressure are controllable. Others are not.  Risk factors you cannot control include:   Race. You may be at higher risk if you are African American.  Age. Risk increases with age.  Gender. Men are at higher risk than women before age 54 years. After age 15, women are at higher risk than men. Risk factors you can control include:  Not getting enough exercise or physical activity.  Being overweight.  Getting too  much fat, sugar, calories, or salt in your diet.  Drinking too much alcohol. SIGNS AND SYMPTOMS Hypertension does not usually cause signs or symptoms. Extremely high blood pressure (hypertensive crisis) may cause headache, anxiety, shortness of breath, and nosebleed. DIAGNOSIS To check if you have hypertension, your health care provider will measure your blood pressure while you are seated, with your arm held at the level of your heart. It should be measured at least twice using the same arm. Certain conditions can cause a difference in blood pressure between your right and left arms. A blood pressure reading that is higher than normal on one occasion does not mean that you need treatment. If it is not clear whether you have high blood pressure, you may be asked to return on a different day to have your blood pressure checked again. Or, you may be asked to monitor your blood pressure at home for 1 or more weeks. TREATMENT Treating high blood pressure includes making lifestyle changes and possibly taking medicine. Living a healthy lifestyle can help lower high blood pressure. You may need to change some of your habits. Lifestyle changes may include:  Following the DASH diet. This diet is high in fruits, vegetables, and whole grains. It is low in salt, red meat, and added sugars.  Keep your sodium intake below 2,300 mg per day.  Getting at least 30-45 minutes of aerobic exercise at least 4 times per week.  Losing weight if necessary.  Not smoking.  Limiting alcoholic beverages.  Learning ways to reduce stress. Your health care provider may prescribe medicine if lifestyle changes are not enough to get your blood pressure under control, and if one of the following is true:  You are 67-14 years of age and your systolic blood pressure is above 140.  You are 68 years of age or older, and your systolic blood pressure is above 150.  Your diastolic blood pressure is above 90.  You have  diabetes, and your systolic blood pressure is over [id number] or your diastolic blood pressure is over 90.  You have kidney disease and your blood pressure is above 140/90.  You have heart disease and your blood pressure is above 140/90. Your personal target blood pressure may vary depending on your medical conditions, your age, and other factors. HOME CARE INSTRUCTIONS  Have your blood pressure rechecked as directed by your health care provider.   Take medicines only as directed by your health care provider. Follow the directions carefully. Blood pressure medicines must be taken as prescribed. The medicine does not work as well when you skip doses. Skipping doses also puts you at risk for problems.  Do not smoke.   Monitor your blood pressure at home as directed by your health care provider. SEEK MEDICAL CARE IF:   You think you are having a reaction to medicines taken.  You have recurrent headaches or feel dizzy.  You have swelling in your ankles.  You have trouble  with your vision. SEEK IMMEDIATE MEDICAL CARE IF:  You develop a severe headache or confusion.  You have unusual weakness, numbness, or feel faint.  You have severe chest or abdominal pain.  You vomit repeatedly.  You have trouble breathing. MAKE SURE YOU:   Understand these instructions.  Will watch your condition.  Will get help right away if you are not doing well or get worse.   This information is not intended to replace advice given to you by your health care provider. Make sure you discuss any questions you have with your health care provider.   Document Released: 03/04/2005 Document Revised: 07/19/2014 Document Reviewed: 12/25/2012 Elsevier Interactive Patient Education 2016 [place].  Hyperglycemia Hyperglycemia occurs when the glucose (sugar) in your blood is too high. Hyperglycemia can happen for many reasons, but it most often happens to people who do not know they have diabetes or are not  managing their diabetes properly.  CAUSES  Whether you have diabetes or not, there are other causes of hyperglycemia. Hyperglycemia can occur when you have diabetes, but it can also occur in other situations that you might not be as aware of, such as: Diabetes  If you have diabetes and are having problems controlling your blood glucose, hyperglycemia could occur because of some of the following reasons:  Not following your meal plan.  Not taking your diabetes medications or not taking it properly.  Exercising less or doing less activity than you normally do.  Being sick. Pre-diabetes  This cannot be ignored. Before people develop Type 2 diabetes, they almost always have "pre-diabetes." This is when your blood glucose levels are higher than normal, but not yet high enough to be diagnosed as diabetes. Research has shown that some long-term damage to the body, especially the heart and circulatory system, may already be occurring during pre-diabetes. If you take action to manage your blood glucose when you have pre-diabetes, you may delay or prevent Type 2 diabetes from developing. Stress  If you have diabetes, you may be "diet" controlled or on oral medications or insulin to control your diabetes. However, you may find that your blood glucose is higher than usual in the hospital whether you have diabetes or not. This is often referred to as "stress hyperglycemia." Stress can elevate your blood glucose. This happens because of hormones put out by the body during times of stress. If stress has been the cause of your high blood glucose, it can be followed regularly by your caregiver. That way he/she can make sure your hyperglycemia does not continue to get worse or progress to diabetes. Steroids  Steroids are medications that act on the infection fighting system (immune system) to block inflammation or infection. One side effect can be a rise in blood glucose. Most people can produce enough extra  insulin to allow for this rise, but for those who cannot, steroids make blood glucose levels go even higher. It is not unusual for steroid treatments to "uncover" diabetes that is developing. It is not always possible to determine if the hyperglycemia will go away after the steroids are stopped. A special blood test called an A1c is sometimes done to determine if your blood glucose was elevated before the steroids were started. SYMPTOMS  Thirsty.  Frequent urination.  Dry mouth.  Blurred vision.  Tired or fatigue.  Weakness.  Sleepy.  Tingling in feet or leg. DIAGNOSIS  Diagnosis is made by monitoring blood glucose in one or all of the following ways:  A1c  test. This is a chemical found in your blood.  Fingerstick blood glucose monitoring.  Laboratory results. TREATMENT  First, knowing the cause of the hyperglycemia is important before the hyperglycemia can be treated. Treatment may include, but is not be limited to:  Education.  Change or adjustment in medications.  Change or adjustment in meal plan.  Treatment for an illness, infection, etc.  More frequent blood glucose monitoring.  Change in exercise plan.  Decreasing or stopping steroids.  Lifestyle changes. HOME CARE INSTRUCTIONS   Test your blood glucose as directed.  Exercise regularly. Your caregiver will give you instructions about exercise. Pre-diabetes or diabetes which comes on with stress is helped by exercising.  Eat wholesome, balanced meals. Eat often and at regular, fixed times. Your caregiver or nutritionist will give you a meal plan to guide your sugar intake.  Being at an ideal weight is important. If needed, losing as little as 10 to 15 pounds may help improve blood glucose levels. SEEK MEDICAL CARE IF:   You have questions about medicine, activity, or diet.  You continue to have symptoms (problems such as increased thirst, urination, or weight gain). SEEK IMMEDIATE MEDICAL CARE IF:    You are vomiting or have diarrhea.  Your breath smells fruity.  You are breathing faster or slower.  You are very sleepy or incoherent.  You have numbness, tingling, or pain in your feet or hands.  You have chest pain.  Your symptoms get worse even though you have been following your caregiver's orders.  If you have any other questions or concerns.   This information is not intended to replace advice given to you by your health care provider. Make sure you discuss any questions you have with your health care provider.   Document Released: 08/28/2000 Document Revised: 05/27/2011 Document Reviewed: 11/08/2014 Elsevier Interactive Patient Education 2016 [place] DASH stands for "Dietary Approaches to Stop Hypertension." The DASH eating plan is a healthy eating plan that has been shown to reduce high blood pressure (hypertension). Additional health benefits may include reducing the risk of type 2 diabetes mellitus, heart disease, and stroke. The DASH eating plan may also help with weight loss. WHAT DO I NEED TO KNOW ABOUT THE DASH EATING PLAN? For the DASH eating plan, you will follow these general guidelines:  Choose foods with a percent daily value for sodium of less than 5% (as listed on the food label).  Use salt-free seasonings or herbs instead of table salt or sea salt.  Check with your health care provider or pharmacist before using salt substitutes.  Eat lower-sodium products, often labeled as "lower sodium" or "no salt added."  Eat fresh foods.  Eat more vegetables, fruits, and low-fat dairy products.  Choose whole grains. Look for the word "whole" as the first word in the ingredient list.  Choose fish and skinless chicken or [place] more often than red meat. Limit fish, poultry, and meat to 6 oz (170 g) each day.  Limit sweets, desserts, sugars, and sugary drinks.  Choose heart-healthy fats.  Limit cheese to 1 oz (28 g) per day.  Eat  more home-cooked food and less restaurant, buffet, and fast food.  Limit fried foods.  Cook foods using methods other than frying.  Limit canned vegetables. If you do use them, rinse them well to decrease the sodium.  When eating at a restaurant, ask that your food be prepared with less salt, or no salt if possible. WHAT FOODS CAN  I EAT? Seek help from a dietitian for individual calorie needs. Grains Whole grain or whole wheat bread. Brown rice. Whole grain or whole wheat pasta. Quinoa, bulgur, and whole grain cereals. Low-sodium cereals. Corn or whole wheat flour tortillas. Whole grain cornbread. Whole grain crackers. Low-sodium crackers. Vegetables Fresh or frozen vegetables (raw, steamed, roasted, or grilled). Low-sodium or reduced-sodium tomato and vegetable juices. Low-sodium or reduced-sodium tomato sauce and paste. Low-sodium or reduced-sodium canned vegetables.  Fruits All fresh, canned (in natural juice), or frozen fruits. Meat and Other Protein Products Ground beef (85% or leaner), grass-fed beef, or beef trimmed of fat. Skinless chicken or [place]. Ground chicken or [place]. Pork trimmed of fat. All fish and seafood. Eggs. Dried beans, peas, or lentils. Unsalted nuts and seeds. Unsalted canned beans. Dairy Low-fat dairy products, such as skim or 1% milk, 2% or reduced-fat cheeses, low-fat ricotta or cottage cheese, or plain low-fat yogurt. Low-sodium or reduced-sodium cheeses. Fats and Oils Tub margarines without trans fats. Light or reduced-fat mayonnaise and salad dressings (reduced sodium). Avocado. Safflower, olive, or canola oils. Natural peanut or almond butter. Other Unsalted popcorn and pretzels. The items listed above may not be a complete list of recommended foods or beverages. Contact your dietitian for more options. WHAT FOODS ARE NOT RECOMMENDED? Grains White bread. White pasta. White rice. Refined cornbread. Bagels and croissants. Crackers that contain trans  fat. Vegetables Creamed or fried vegetables. Vegetables in a cheese sauce. Regular canned vegetables. Regular canned tomato sauce and paste. Regular tomato and vegetable juices. Fruits Dried fruits. Canned fruit in light or heavy syrup. Fruit juice. Meat and Other Protein Products Fatty cuts of meat. Ribs, chicken wings, bacon, sausage, bologna, salami, chitterlings, fatback, hot dogs, bratwurst, and packaged luncheon meats. Salted nuts and seeds. Canned beans with salt. Dairy Whole or 2% milk, cream, half-and-half, and cream cheese. Whole-fat or sweetened yogurt. Full-fat cheeses or blue cheese. Nondairy creamers and whipped toppings. Processed cheese, cheese spreads, or cheese curds. Condiments Onion and garlic salt, seasoned salt, table salt, and sea salt. Canned and packaged gravies. Worcestershire sauce. Tartar sauce. Barbecue sauce. Teriyaki sauce. Soy sauce, including reduced sodium. Steak sauce. Fish sauce. Oyster sauce. Cocktail sauce. Horseradish. Ketchup and mustard. Meat flavorings and tenderizers. Bouillon cubes. Hot sauce. Tabasco sauce. Marinades. Taco seasonings. Relishes. Fats and Oils Butter, stick margarine, lard, shortening, ghee, and bacon fat. Coconut, palm kernel, or palm oils. Regular salad dressings. Other Pickles and olives. Salted popcorn and pretzels. The items listed above may not be a complete list of foods and beverages to avoid. Contact your dietitian for more information. WHERE CAN I FIND MORE INFORMATION? National Heart, Lung, and Blood Institute: travelstabloid.com   This information is not intended to replace advice given to you by your health care provider. Make sure you discuss any questions you have with your health care provider.   Document Released: 02/21/2011 Document Revised: 03/25/2014 Document Reviewed: 01/06/2013 Elsevier Interactive Patient Education 2016 [employer] Inc[employer]  Back Pain, Adult Back pain is very common  in adults.The cause of back pain is rarely dangerous and the pain often gets better over time.The cause of your back pain may not be known. Some common causes of back pain include:  Strain of the muscles or ligaments supporting the spine.  Wear and tear (degeneration) of the spinal disks.  Arthritis.  Direct injury to the back. For many people, back pain may return. Since back pain is rarely dangerous, most people can learn to manage this condition on their own.  HOME CARE INSTRUCTIONS Watch your back pain for any changes. The following actions may help to lessen any discomfort you are feeling:  Remain active. It is stressful on your back to sit or stand in one place for long periods of time. Do not sit, drive, or stand in one place for more than 30 minutes at a time. Take short walks on even surfaces as soon as you are able.Try to increase the length of time you walk each day.  Exercise regularly as directed by your health care provider. Exercise helps your back heal faster. It also helps avoid future injury by keeping your muscles strong and flexible.  Do not stay in bed.Resting more than 1-2 days can delay your recovery.  Pay attention to your body when you bend and lift. The most comfortable positions are those that put less stress on your recovering back. Always use proper lifting techniques, including:  Bending your knees.  Keeping the load close to your body.  Avoiding twisting.  Find a comfortable position to sleep. Use a firm mattress and lie on your side with your knees slightly bent. If you lie on your back, put a pillow under your knees.  Avoid feeling anxious or stressed.Stress increases muscle tension and can worsen back pain.It is important to recognize when you are anxious or stressed and learn ways to manage it, such as with exercise.  Take medicines only as directed by your health care provider. Over-the-counter medicines to reduce pain and inflammation are often  the most helpful.Your health care provider may prescribe muscle relaxant drugs.These medicines help dull your pain so you can more quickly return to your normal activities and healthy exercise.  Apply ice to the injured area:  Put ice in a plastic bag.  Place a towel between your skin and the bag.  Leave the ice on for 20 minutes, 2-3 times a day for the first 2-3 days. After that, ice and heat may be alternated to reduce pain and spasms.  Maintain a healthy weight. Excess weight puts extra stress on your back and makes it difficult to maintain good posture. SEEK MEDICAL CARE IF:  You have pain that is not relieved with rest or medicine.  You have increasing pain going down into the legs or buttocks.  You have pain that does not improve in one week.  You have night pain.  You lose weight.  You have a fever or chills. SEEK IMMEDIATE MEDICAL CARE IF:   You develop new bowel or bladder control problems.  You have unusual weakness or numbness in your arms or legs.  You develop nausea or vomiting.  You develop abdominal pain.  You feel faint.   This information is not intended to replace advice given to you by your health care provider. Make sure you discuss any questions you have with your health care provider.   Document Released: 03/04/2005 Document Revised: 03/25/2014 Document Reviewed: 07/06/2013 Elsevier Interactive Patient Education [employer].

## 2015-10-12 ENCOUNTER — Other Ambulatory Visit (HOSPITAL_COMMUNITY): Payer: Self-pay | Admitting: Internal Medicine

## 2015-10-12 DIAGNOSIS — Z780 Asymptomatic menopausal state: Secondary | ICD-10-CM

## 2015-10-12 DIAGNOSIS — Z1231 Encounter for screening mammogram for malignant neoplasm of breast: Secondary | ICD-10-CM

## 2015-10-23 ENCOUNTER — Ambulatory Visit (HOSPITAL_COMMUNITY)
Admission: RE | Admit: 2015-10-23 | Discharge: 2015-10-23 | Disposition: A | Discharge status: Home / Self Care | Payer: BC Managed Care – PPO | Source: Ambulatory Visit | Attending: Internal Medicine | Admitting: Internal Medicine

## 2015-10-23 DIAGNOSIS — Z1231 Encounter for screening mammogram for malignant neoplasm of breast: Secondary | ICD-10-CM | POA: Diagnosis present

## 2015-10-23 DIAGNOSIS — Z780 Asymptomatic menopausal state: Secondary | ICD-10-CM | POA: Diagnosis present

## 2015-11-10 ENCOUNTER — Encounter: Payer: Self-pay | Admitting: Nurse Practitioner

## 2015-11-10 ENCOUNTER — Other Ambulatory Visit: Payer: Self-pay

## 2015-11-10 ENCOUNTER — Ambulatory Visit (INDEPENDENT_AMBULATORY_CARE_PROVIDER_SITE_OTHER): Payer: BC Managed Care – PPO | Admitting: Nurse Practitioner

## 2015-11-10 DIAGNOSIS — R7989 Other specified abnormal findings of blood chemistry: Secondary | ICD-10-CM | POA: Diagnosis not present

## 2015-11-10 DIAGNOSIS — Z1211 Encounter for screening for malignant neoplasm of colon: Secondary | ICD-10-CM | POA: Diagnosis not present

## 2015-11-10 DIAGNOSIS — R945 Abnormal results of liver function studies: Principal | ICD-10-CM

## 2015-11-10 MED ORDER — NA SULFATE-K SULFATE-MG SULF 17.5-3.13-1.6 GM/177ML PO SOLN: 1.0000 | ORAL | 0 refills | Status: DC

## 2015-11-10 NOTE — Progress Notes (Signed)
cc'ed to pcp °

## 2015-11-10 NOTE — Progress Notes (Signed)
    Primary Care Physician:  Lanette Hampshire, MD Primary Gastroenterologist:  Dr. Oneida Alar  Chief Complaint  Patient presents with  . Colonoscopy    HPI:   Anna Hale is a 60 y.o. female who presents to schedule for sever screening colonoscopy. Phone triage was deferred to office visit due to mildly elevated ALT at 45 and creatinine at 1.49. No previous colonoscopy found in our system.  Today she states she has never had a colonoscopy. Denies abdominal pain, N/V, hematochezia, melena, acute changes in bowel habits, fever, chills, unintentional weight loss. Was just diagnosed with hypertension a month ago. Denies history of kidney disease. Denies chest pain, dyspnea, dizziness, lightheadedness, syncope, near syncope. Denies any other upper or lower GI symptoms.  Past Medical History:  Diagnosis Date  . Diabetes ([place])   . Hypertension     Past Surgical History:  Procedure Laterality Date  . CESAREAN SECTION      Current Outpatient Prescriptions  Medication Sig Dispense Refill  . Aspirin-Omeprazole (YOSPRALA) 81-40 MG TBEC Take by mouth.    . losartan-hydrochlorothiazide (HYZAAR) 100-25 MG tablet Take 1 tablet by mouth daily.    . metFORMIN (GLUCOPHAGE) 1000 MG tablet Take 1,000 mg by mouth 2 (two) times daily with a meal.     No current facility-administered medications for this visit.     Allergies as of 11/10/2015 - Review Complete 11/10/2015  Allergen Reaction Noted  . Compazine Other (See Comments) 05/04/2011    No family history on file.  Social History   Social History  . Marital status: Single    Spouse name: N/A  . Number of children: N/A  . Years of education: N/A   Occupational History  . Not on file.   Social History Main Topics  . Smoking status: Current Every Day Smoker    Packs/day: 0.75    Years: 25.00    Types: Cigarettes  . Smokeless tobacco: Never Used  . Alcohol use No  . Drug use: No  . Sexual activity: Not on file   Other Topics  Concern  . Not on file   Social History Narrative  . No narrative on file    Review of Systems: 10-point ROS negative except as per HPI.    Physical Exam: BP (!) 156/80   Pulse 81   Temp 97.8 F (36.6 C) (Oral)   Ht 5\' 6"  (1.676 m)   Wt 201 lb 6.4 oz (91.4 kg)   BMI 32.51 kg/m  General:   Alert and oriented. Pleasant and cooperative. Well-nourished and well-developed.  Head:  Normocephalic and atraumatic. Eyes:  Without icterus, sclera clear and conjunctiva pink.  Ears:  Normal auditory acuity. Cardiovascular:  S1, S2 present without murmurs appreciated. Extremities without clubbing or edema. Respiratory:  Clear to auscultation bilaterally. No wheezes, rales, or rhonchi. No distress.  Gastrointestinal:  +BS, rounded but soft, non-tender and non-distended. No HSM noted. No guarding or rebound. No masses appreciated.  Rectal:  Deferred  Musculoskalatal:  Symmetrical without gross deformities Neurologic:  Alert and oriented x4;  grossly normal neurologically. Psych:  Alert and cooperative. Normal mood and affect. Heme/Lymph/Immune: No excessive bruising noted.    11/10/2015 10:32 AM   Disclaimer: This note was dictated with voice recognition software. Similar sounding words can inadvertently be transcribed and may not be corrected upon review.

## 2015-11-10 NOTE — Assessment & Plan Note (Signed)
60 year old African-American female with no history of prior colonoscopy. She is currently overdue for her first-ever screening colonoscopy. She was referred to [place] for office visit versus phone triage due to elevated LFTs as noted above. Also has mildly elevated creatinine at 1.49. She was recently diagnosed with diabetes and hypertension and this is likely the underlying etiology. We will proceed with colonoscopy as recommended.  Proceed with colonoscopy with Dr. Oneida Alar in the near future. The risks, benefits, and alternatives have been discussed in detail with the patient. They state understanding and desire to proceed.   The patient is not on any anticoagulants, anxiolytics, chronic pain medications, or antidepressants. Conscious sedation should be adequate for her procedure.

## 2015-11-10 NOTE — Patient Instructions (Signed)
1. We will schedule your procedure for you. 2. Have your blood work drawn when you're able to. 3. Return for follow-up in 3 months.

## 2015-11-10 NOTE — Assessment & Plan Note (Signed)
Last provided by primary care show mild elevation in ALT at 45. Denies alcohol and drug use. No statin medications. Likely fatty liver, however she is within the birth: [place] at high risk for hepatitis C. I discussed this with her and she has agreed for hepatitis C antibody one time screening based on CDC recommendations. I will also recheck her CMP at this time. Return for follow-up in 3 months.

## 2015-11-13 ENCOUNTER — Ambulatory Visit: Payer: BC Managed Care – PPO | Admitting: Nurse Practitioner

## 2015-11-17 LAB — COMPREHENSIVE METABOLIC PANEL
ALBUMIN: 4.3 g/dL (ref 3.6–5.1)
ALK PHOS: 64 U/L (ref 33–130)
ALT: 49 U/L — AB (ref 6–29)
AST: 33 U/L (ref 10–35)
BILIRUBIN TOTAL: 0.5 mg/dL (ref 0.2–1.2)
BUN: 41 mg/dL — ABNORMAL HIGH (ref 7–25)
CO2: 25 mmol/L (ref 20–31)
CREATININE: 1.77 mg/dL — AB (ref 0.50–0.99)
Calcium: 9.6 mg/dL (ref 8.6–10.4)
Chloride: 105 mmol/L (ref 98–110)
GLUCOSE: 101 mg/dL — AB (ref 65–99)
Potassium: 5.1 mmol/L (ref 3.5–5.3)
SODIUM: 139 mmol/L (ref 135–146)
Total Protein: 7.3 g/dL (ref 6.1–8.1)

## 2015-11-17 LAB — HEPATITIS C ANTIBODY: HCV AB: NEGATIVE

## 2015-11-21 NOTE — Progress Notes (Signed)
LMOM to call.

## 2015-11-22 LAB — HEPATITIS C GENOTYPE: HCV Genotype: NOT DETECTED

## 2015-11-24 ENCOUNTER — Ambulatory Visit (HOSPITAL_COMMUNITY)
Admission: RE | Admit: 2015-11-24 | Discharge: 2015-11-24 | Disposition: A | Discharge status: Home / Self Care | Payer: BC Managed Care – PPO | Source: Ambulatory Visit | Attending: Gastroenterology | Admitting: Gastroenterology

## 2015-11-24 ENCOUNTER — Encounter (HOSPITAL_COMMUNITY)
Admission: RE | Disposition: A | Discharge status: Home / Self Care | Payer: Self-pay | Source: Ambulatory Visit | Attending: Gastroenterology

## 2015-11-24 ENCOUNTER — Encounter (HOSPITAL_COMMUNITY): Payer: Self-pay

## 2015-11-24 DIAGNOSIS — K621 Rectal polyp: Secondary | ICD-10-CM

## 2015-11-24 DIAGNOSIS — E119 Type 2 diabetes mellitus without complications: Secondary | ICD-10-CM | POA: Insufficient documentation

## 2015-11-24 DIAGNOSIS — K635 Polyp of colon: Secondary | ICD-10-CM

## 2015-11-24 DIAGNOSIS — Z1211 Encounter for screening for malignant neoplasm of colon: Secondary | ICD-10-CM

## 2015-11-24 DIAGNOSIS — K633 Ulcer of intestine: Secondary | ICD-10-CM | POA: Diagnosis not present

## 2015-11-24 DIAGNOSIS — Z7984 Long term (current) use of oral hypoglycemic drugs: Secondary | ICD-10-CM | POA: Insufficient documentation

## 2015-11-24 DIAGNOSIS — K5730 Diverticulosis of large intestine without perforation or abscess without bleeding: Secondary | ICD-10-CM | POA: Insufficient documentation

## 2015-11-24 DIAGNOSIS — D124 Benign neoplasm of descending colon: Secondary | ICD-10-CM | POA: Insufficient documentation

## 2015-11-24 DIAGNOSIS — Z7982 Long term (current) use of aspirin: Secondary | ICD-10-CM | POA: Insufficient documentation

## 2015-11-24 DIAGNOSIS — F17210 Nicotine dependence, cigarettes, uncomplicated: Secondary | ICD-10-CM | POA: Diagnosis not present

## 2015-11-24 DIAGNOSIS — I10 Essential (primary) hypertension: Secondary | ICD-10-CM | POA: Diagnosis not present

## 2015-11-24 DIAGNOSIS — K648 Other hemorrhoids: Secondary | ICD-10-CM | POA: Diagnosis not present

## 2015-11-24 HISTORY — PX: COLONOSCOPY: SHX5424

## 2015-11-24 SURGERY — COLONOSCOPY
Anesthesia: Moderate Sedation

## 2015-11-24 MED ORDER — MEPERIDINE HCL 100 MG/ML IJ SOLN
INTRAMUSCULAR | Status: DC | PRN
  Administered 2015-11-24 (×2): 25 mg via INTRAVENOUS

## 2015-11-24 MED ORDER — MIDAZOLAM HCL 5 MG/5ML IJ SOLN
INTRAMUSCULAR | Status: AC
  Filled 2015-11-24: qty 10

## 2015-11-24 MED ORDER — SODIUM CHLORIDE 0.9 % IV SOLN
INTRAVENOUS | Status: DC
  Administered 2015-11-24: 1000 mL via INTRAVENOUS

## 2015-11-24 MED ORDER — MEPERIDINE HCL 100 MG/ML IJ SOLN
INTRAMUSCULAR | Status: AC
  Filled 2015-11-24: qty 2

## 2015-11-24 MED ORDER — MIDAZOLAM HCL 5 MG/5ML IJ SOLN
INTRAMUSCULAR | Status: DC | PRN
  Administered 2015-11-24: 1 mg via INTRAVENOUS
  Administered 2015-11-24 (×2): 2 mg via INTRAVENOUS

## 2015-11-24 NOTE — H&P (Signed)
  Primary Care Physician:  Jani Gravel, MD Primary Gastroenterologist:  Dr. Oneida Alar  Pre-Procedure History & Physical: HPI:  Anna Hale is a 60 y.o. female here for [place].  Past Medical History:  Diagnosis Date  . Diabetes ([place])   . Hypertension     Past Surgical History:  Procedure Laterality Date  . CESAREAN SECTION      Prior to Admission medications   Medication Sig Start Date End Date Taking? Authorizing Provider  Aspirin-Omeprazole (YOSPRALA) 81-40 MG TBEC Take by mouth.   Yes Historical Provider, MD  losartan-hydrochlorothiazide (HYZAAR) 100-25 MG tablet Take 1 tablet by mouth daily.   Yes Historical Provider, MD  metFORMIN (GLUCOPHAGE) 1000 MG tablet Take 1,000 mg by mouth 2 (two) times daily with a meal.   Yes Historical Provider, MD  Na Sulfate-K Sulfate-Mg Sulf (SUPREP BOWEL PREP KIT) 17.5-3.13-1.6 GM/180ML SOLN Take 1 kit by mouth as directed. 11/10/15  Yes Danie Binder, MD    Allergies as of 11/10/2015 - Review Complete 11/10/2015  Allergen Reaction Noted  . Compazine Other (See Comments) 05/04/2011    History reviewed. No pertinent family history.  Social History   Social History  . Marital status: Single    Spouse name: N/A  . Number of children: N/A  . Years of education: N/A   Occupational History  . Not on file.   Social History Main Topics  . Smoking status: Current Every Day Smoker    Packs/day: 0.75    Years: 25.00    Types: Cigarettes  . Smokeless tobacco: Never Used  . Alcohol use No  . Drug use: No  . Sexual activity: Not on file   Other Topics Concern  . Not on file   Social History Narrative  . No narrative on file    Review of Systems: See HPI, otherwise negative ROS   Physical Exam: BP 94/63   Pulse 75   Temp 98.1 F (36.7 C) (Oral)   Resp (!) 22   SpO2 100%  General:   Alert,  pleasant and cooperative in NAD Head:  Normocephalic and atraumatic. Neck:  Supple; Lungs:  Clear throughout to  auscultation.    Heart:  Regular rate and rhythm. Abdomen:  Soft, nontender and nondistended. Normal bowel sounds, without guarding, and without rebound.   Neurologic:  Alert and  oriented x4;  grossly normal neurologically.  Impression/Plan:     SCREENING  Plan:  1. TCS TODAY. DISCUSSED PROCEDURE, BENEFITS, & RISKS: < 1% chance of medication reaction, bleeding, perforation, or rupture of spleen/liver.

## 2015-11-24 NOTE — Op Note (Signed)
[facility] Patient Name: Anna Hale Procedure Date: 11/24/2015 2:42 PM MRN: [id number] Date of Birth: [birth year] Attending MD: Barney Drain , MD CSN: [id number] Age: 60 Admit Type: Outpatient Procedure:                Colonoscopy WITH COLD FORCEPS BIOPSY IC VALVE AND                            POLYPECTOMY/SNARE POLYPECTOMY Indications:              Screening for colorectal malignant neoplasm-DAILLY                            ASA WITH PPI. ELEVATED ALT(49), BMI 32. Providers:                Barney Drain, MD, Lurline Del, RN, Isabella Stalling,                            Technician Referring MD:             Teodora Medici. Kim Medicines:                Meperidine 50 mg IV, Midazolam 5 mg IV Complications:            No immediate complications. Estimated Blood Loss:     Estimated blood loss was minimal. Procedure:                Pre-Anesthesia Assessment:                           - Prior to the procedure, a History and Physical                            was performed, and patient medications and                            allergies were reviewed. The patient's tolerance of                            previous anesthesia was also reviewed. The risks                            and benefits of the procedure and the sedation                            options and risks were discussed with the patient.                            All questions were answered, and informed consent                            was obtained. Prior Anticoagulants: The patient has                            taken aspirin, last dose was day of procedure. ASA  Grade Assessment: II - A patient with mild systemic                            disease. After reviewing the risks and benefits,                            the patient was deemed in satisfactory condition to                            undergo the procedure. After obtaining informed                            consent, the colonoscope was  passed under direct                            vision. Throughout the procedure, the patient's                            blood pressure, pulse, and oxygen saturations were                            monitored continuously. The [id number]                            scope was introduced through the anus and advanced                            to the the cecum, identified by appendiceal orifice                            and ileocecal valve. The ileocecal valve,                            appendiceal orifice, and rectum were photographed.                            The colonoscopy was somewhat difficult due to a                            tortuous colon. Successful completion of the                            procedure was aided by COLOWRAP. The patient                            tolerated the procedure fairly well. The quality of                            the bowel preparation was excellent. Scope In: 3:14:37 PM Scope Out: 3:41:26 PM Scope Withdrawal Time: 0 hours 20 minutes 55 seconds  Total Procedure Duration: 0 hours 26 minutes 49 seconds  Findings:      A 8 mm polyp was found in the descending colon. The polyp was sessile.  The polyp was removed with a hot snare(#2). Resection and retrieval were       complete.      Four sessile polyps were found in the rectum. The polyps were 2 to 4 mm       in size. These polyps were removed with a cold biopsy forceps(#3).       Resection and retrieval were complete.      Discontinuous areas of nonbleeding ulcerated mucosa with no stigmata of       recent bleeding were present at the ileocecal valve. Biopsies were taken       with a cold forceps for histology(#1).      Many small and large-mouthed diverticula were found in the sigmoid colon.      Internal hemorrhoids were found during retroflexion. The hemorrhoids       were small. Impression:               - One 8 mm polyp in the descending colon, removed                             with a hot snare. Resected and retrieved.                           - Four 2 to 4 mm polyps in the rectum, removed with                            a cold biopsy forceps. Resected and retrieved.                           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                           - MILD Diverticulosis in the sigmoid colon.                           - Internal hemorrhoids. Moderate Sedation:      Moderate (conscious) sedation was administered by the endoscopy nurse       and supervised by the endoscopist. The following parameters were       monitored: oxygen saturation, heart rate, blood pressure, and response       to care. Total physician intraservice time was 38 minutes. Recommendation:           - High fiber diet.                           - Continue present medications.                           - Await pathology results.                           - Repeat colonoscopy in 5-10 years for surveillance.                           -  LOSE TEN POUNDS.                           - RUQ [place] TO EVALUATE FOR FATTY LIVER DISEASE.                           - PREPARATION H IF NEEDED                           - Return to my office in 5 months.                           - Patient has a contact number available for                            emergencies. The signs and symptoms of potential                            delayed complications were discussed with the                            patient. Return to normal activities tomorrow.                            Written discharge instructions were provided to the                            patient. Procedure Code(s):        --- Professional ---                           [phone], Colonoscopy, flexible; with removal of                            tumor(s), polyp(s), or other lesion(s) by snare                            technique                           45380, 59, Colonoscopy, flexible; with biopsy,                             single or multiple                           99152, Moderate sedation services provided by the                            same physician or other qualified health care                            professional performing the diagnostic or                            therapeutic service that the sedation supports,  requiring the presence of an independent trained                            observer to assist in the monitoring of the                            patient's level of consciousness and physiological                            status; initial 15 minutes of intraservice time,                            patient age 37 years or older                           [id number], Moderate sedation services; each additional                            15 minutes intraservice time                           [phone], Moderate sedation services; each additional                            15 minutes intraservice time Diagnosis Code(s):        --- Professional ---                           Z12.11, Encounter for screening for malignant                            neoplasm of colon                           D12.4, Benign neoplasm of descending colon                           K62.1, Rectal polyp                           K63.3, Ulcer of intestine                           K64.8, Other hemorrhoids                           K57.30, Diverticulosis of large intestine without                            perforation or abscess without bleeding CPT copyright 2016 [employer]. All rights reserved. The codes documented in this report are preliminary and upon coder review may  be revised to meet current compliance requirements. Barney Drain, MD Barney Drain, MD 11/24/2015 4:10:55 PM This report has been signed electronically. Number of Addenda: 0

## 2015-11-24 NOTE — Discharge Instructions (Addendum)
You have SMALL lnternal hemorrhoids. YOU HAVE diverticulosis IN YOUR LEFT COLON. YOU HAVE A SMALL ULCER IN YOUR RIGHT COLON. YOU HAD FIVE SMALL POLYPS REMOVED.    DRINK WATER TO KEEP YOUR URINE LIGHT YELLOW.  FOLLOW A HIGH FIBER DIET. AVOID ITEMS THAT CAUSE BLOATING. See info below.  CONTINUE YOUR WEIGHT LOSS EFFORTS. LOSE TEN POUNDS.  USE PREPARATION H FOUR TIMES  A DAY IF NEEDED TO RELIEVE RECTAL PAIN/PRESSURE/BLEEDING.   YOUR BIOPSY RESULTS WILL BE AVAILABLE IN MY CHART  SEP 12 AND MY OFFICE WILL CONTACT YOU IN 10-14 DAYS WITH YOUR RESULTS.   Complete ultrasound to look for fatty liver disease within the next week.  FOLLOW UP IN 6 MOS.   Next colonoscopy in 5-10 years.  Colonoscopy Care After Read the instructions outlined below and refer to this sheet in the next week. These discharge instructions provide you with general information on caring for yourself after you leave the hospital. While your treatment has been planned according to the most current medical practices available, unavoidable complications occasionally occur. If you have any problems or questions after discharge, call DR. Freddrick Gladson, [phone].  ACTIVITY  You may resume your regular activity, but move at a slower pace for the next 24 hours.   Take frequent rest periods for the next 24 hours.   Walking will help get rid of the air and reduce the bloated feeling in your belly (abdomen).   No driving for 24 hours (because of the medicine (anesthesia) used during the test).   You may shower.   Do not sign any important legal documents or operate any machinery for 24 hours (because of the anesthesia used during the test).    NUTRITION  Drink plenty of fluids.   You may resume your normal diet as instructed by your doctor.   Begin with a light meal and progress to your normal diet. Heavy or fried foods are harder to digest and may make you feel sick to your stomach (nauseated).   Avoid alcoholic beverages  for 24 hours or as instructed.    MEDICATIONS  You may resume your normal medications.   WHAT YOU CAN EXPECT TODAY  Some feelings of bloating in the abdomen.   Passage of more gas than usual.   Spotting of blood in your stool or on the toilet paper  .  IF YOU HAD POLYPS REMOVED DURING THE COLONOSCOPY:  Eat a soft diet IF YOU HAVE NAUSEA, BLOATING, ABDOMINAL PAIN, OR VOMITING.    FINDING OUT THE RESULTS OF YOUR TEST Not all test results are available during your visit. DR. Oneida Alar WILL CALL YOU WITHIN 14 DAYS OF YOUR PROCEDUE WITH YOUR RESULTS. Do not assume everything is normal if you have not heard from DR. Keaston Pile, CALL HER OFFICE AT [phone].  SEEK IMMEDIATE MEDICAL ATTENTION AND CALL THE OFFICE: [phone] IF:  You have more than a spotting of blood in your stool.   Your belly is swollen (abdominal distention).   You are nauseated or vomiting.   You have a temperature over 101F.   You have abdominal pain or discomfort that is severe or gets worse throughout the day.  High-Fiber Diet A high-fiber diet changes your normal diet to include more whole grains, legumes, fruits, and vegetables. Changes in the diet involve replacing refined carbohydrates with unrefined foods. The calorie level of the diet is essentially unchanged. The Dietary Reference Intake (recommended amount) for adult males is 38 grams per day. For adult females, it is  25 grams per day. Pregnant and lactating women should consume 28 grams of fiber per day. Fiber is the intact part of a plant that is not broken down during digestion. Functional fiber is fiber that has been isolated from the plant to provide a beneficial effect in the body. PURPOSE  Increase stool bulk.   Ease and regulate bowel movements.   Lower cholesterol.  REDUCE RISK OF COLON CANCER  INDICATIONS THAT YOU NEED MORE FIBER  Constipation and hemorrhoids.   Uncomplicated diverticulosis (intestine condition) and irritable bowel  syndrome.   Weight management.   As a protective measure against hardening of the arteries (atherosclerosis), diabetes, and cancer.   GUIDELINES FOR INCREASING FIBER IN THE DIET  Start adding fiber to the diet slowly. A gradual increase of about 5 more grams (2 slices of whole-wheat bread, 2 servings of most fruits or vegetables, or 1 bowl of high-fiber cereal) per day is best. Too rapid an increase in fiber may result in constipation, flatulence, and bloating.   Drink enough water and fluids to keep your urine clear or pale yellow. Water, juice, or caffeine-free drinks are recommended. Not drinking enough fluid may cause constipation.   Eat a variety of high-fiber foods rather than one type of fiber.   Try to increase your intake of fiber through using high-fiber foods rather than fiber pills or supplements that contain small amounts of fiber.   The goal is to change the types of food eaten. Do not supplement your present diet with high-fiber foods, but replace foods in your present diet.   INCLUDE A VARIETY OF FIBER SOURCES  Replace refined and processed grains with whole grains, canned fruits with fresh fruits, and incorporate other fiber sources. White rice, white breads, and most bakery goods contain little or no fiber.   Brown whole-grain rice, buckwheat oats, and many fruits and vegetables are all good sources of fiber. These include: broccoli, Brussels sprouts, cabbage, cauliflower, beets, sweet potatoes, white potatoes (skin on), carrots, tomatoes, eggplant, squash, berries, fresh fruits, and dried fruits.   Cereals appear to be the richest source of fiber. Cereal fiber is found in whole grains and bran. Bran is the fiber-rich outer coat of cereal grain, which is largely removed in refining. In whole-grain cereals, the bran remains. In breakfast cereals, the largest amount of fiber is found in those with "bran" in their names. The fiber content is sometimes indicated on the label.     You may need to include additional fruits and vegetables each day.   In baking, for 1 cup white flour, you may use the following substitutions:   1 cup whole-wheat flour minus 2 tablespoons.   1/2 cup white flour plus 1/2 cup whole-wheat flour.   Polyps, Colon  A polyp is extra tissue that grows inside your body. Colon polyps grow in the large intestine. The large intestine, also called the colon, is part of your digestive system. It is a long, hollow tube at the end of your digestive tract where your body makes and stores stool. Most polyps are not dangerous. They are benign. This means they are not cancerous. But over time, some types of polyps can turn into cancer. Polyps that are smaller than a pea are usually not harmful. But larger polyps could someday become or may already be cancerous. To be safe, doctors remove all polyps and test them.   PREVENTION There is not one sure way to prevent polyps. You might be able to lower your risk  of getting them if you:  Eat more fruits and vegetables and less fatty food.   Do not smoke.   Avoid alcohol.   Exercise every day.   Lose weight if you are overweight.   Eating more calcium and folate can also lower your risk of getting polyps. Some foods that are rich in calcium are milk, cheese, and broccoli. Some foods that are rich in folate are chickpeas, kidney beans, and spinach.    Diverticulosis Diverticulosis is a common condition that develops when small pouches (diverticula) form in the wall of the colon. The risk of diverticulosis increases with age. It happens more often in people who eat a low-fiber diet. Most individuals with diverticulosis have no symptoms. Those individuals with symptoms usually experience belly (abdominal) pain, constipation, or loose stools (diarrhea).  HOME CARE INSTRUCTIONS  Increase the amount of fiber in your diet as directed by your caregiver or dietician. This may reduce symptoms of diverticulosis.    Drink at least 6 to 8 glasses of water each day to prevent constipation.   Try not to strain when you have a bowel movement.   Avoiding nuts and seeds to prevent complications is NOT NECESSARY.     FOODS HAVING HIGH FIBER CONTENT INCLUDE:  Fruits. Apple, peach, pear, tangerine, raisins, prunes.   Vegetables. Brussels sprouts, asparagus, broccoli, cabbage, carrot, cauliflower, romaine lettuce, spinach, summer squash, tomato, winter squash, zucchini.   Starchy Vegetables. Baked beans, kidney beans, lima beans, split peas, lentils, potatoes (with skin).   Grains. Whole wheat bread, brown rice, bran flake cereal, plain oatmeal, white rice, shredded wheat, bran muffins.    SEEK IMMEDIATE MEDICAL CARE IF:  You develop increasing pain or severe bloating.   You have an oral temperature above 101F.   You develop vomiting or bowel movements that are bloody or black.   Hemorrhoids Hemorrhoids are dilated (enlarged) veins around the rectum. Sometimes clots will form in the veins. This makes them swollen and painful. These are called thrombosed hemorrhoids. Causes of hemorrhoids include:  Constipation.   Straining to have a bowel movement.   HEAVY LIFTING  HOME CARE INSTRUCTIONS  Eat a well balanced diet and drink 6 to 8 glasses of water every day to avoid constipation. You may also use a bulk laxative.   Avoid straining to have bowel movements.   Keep anal area dry and clean.   Do not use a donut shaped pillow or sit on the toilet for long periods. This increases blood pooling and pain.   Move your bowels when your body has the urge; this will require less straining and will decrease pain and pressure.

## 2015-11-27 ENCOUNTER — Encounter: Payer: Self-pay | Admitting: Gastroenterology

## 2015-11-30 ENCOUNTER — Encounter (HOSPITAL_COMMUNITY): Payer: Self-pay | Admitting: Gastroenterology

## 2015-12-03 ENCOUNTER — Telehealth: Payer: Self-pay | Admitting: Gastroenterology

## 2015-12-03 NOTE — Telephone Encounter (Signed)
Please call pt. She had HYPERPLASTIC POLYPS removed. THE SMALL COLON ULCER IS DUE TO ASA USE.   DRINK WATER TO KEEP YOUR URINE LIGHT YELLOW.  FOLLOW A HIGH FIBER DIET. AVOID ITEMS THAT CAUSE BLOATING.   CONTINUE YOUR WEIGHT LOSS EFFORTS. LOSE TEN POUNDS.  USE PREPARATION H FOUR TIMES  A DAY IF NEEDED TO RELIEVE RECTAL PAIN/PRESSURE/BLEEDING.   Complete ultrasound to look for fatty liver disease within the next week.  FOLLOW UP IN FEB 2018.   Next colonoscopy in 10 years.

## 2015-12-04 ENCOUNTER — Other Ambulatory Visit: Payer: Self-pay

## 2015-12-04 ENCOUNTER — Telehealth: Payer: Self-pay

## 2015-12-04 DIAGNOSIS — K760 Fatty (change of) liver, not elsewhere classified: Secondary | ICD-10-CM

## 2015-12-04 NOTE — Telephone Encounter (Signed)
Called and informed pt of [place] Abd appt 12/07/15 at 10:00am. NPO after midnight. Informed pt of SLF recommendations following recent TCS.

## 2015-12-04 NOTE — Telephone Encounter (Signed)
Pre Martina's phone note    Called and informed pt of [place] Abd appt 12/07/15 at 10:00am. NPO after midnight. Informed pt of SLF recommendations following recent TCS.

## 2015-12-04 NOTE — Telephone Encounter (Signed)
ON RECALL AND APPT MADE ° °

## 2015-12-07 ENCOUNTER — Ambulatory Visit (HOSPITAL_COMMUNITY)
Admission: RE | Admit: 2015-12-07 | Discharge: 2015-12-07 | Disposition: A | Discharge status: Home / Self Care | Payer: BC Managed Care – PPO | Source: Ambulatory Visit | Attending: Gastroenterology | Admitting: Gastroenterology

## 2015-12-07 DIAGNOSIS — K760 Fatty (change of) liver, not elsewhere classified: Secondary | ICD-10-CM | POA: Diagnosis not present

## 2015-12-19 ENCOUNTER — Telehealth: Payer: Self-pay | Admitting: Gastroenterology

## 2015-12-19 DIAGNOSIS — R748 Abnormal levels of other serum enzymes: Secondary | ICD-10-CM

## 2015-12-19 NOTE — Telephone Encounter (Signed)
Pt is aware and lab orders have been faxed to [place].

## 2015-12-19 NOTE — Telephone Encounter (Signed)
PLEASE CALL PT. HER US SHOWS AN ESSENTIALLY NORMAL. SHE NEEDS TO COMPLETE LABS TO EVALUATE FOR AUTOIMMUNE HEPATITIS: ANA, ASMA, qIg.

## 2015-12-21 LAB — ANA: Anti Nuclear Antibody(ANA): NEGATIVE

## 2015-12-21 LAB — IGG, IGA, IGM
IGG (IMMUNOGLOBIN G), SERUM: 1213 mg/dL (ref 694–1618)
IgA: 290 mg/dL (ref 81–463)
IgM, Serum: 33 mg/dL — ABNORMAL LOW (ref 48–271)

## 2015-12-25 LAB — ANTI-SMOOTH MUSCLE ANTIBODY, IGG

## 2016-01-12 ENCOUNTER — Telehealth: Payer: Self-pay | Admitting: Gastroenterology

## 2016-01-12 NOTE — Telephone Encounter (Signed)
PLEASE CALL PT. HER TEST FOR AUTOIMMUNE HEPATITIS ARE NEGATIVE. SHE SHOULD LOSE 20 LBS. FOLLOW  A LOW FAT DIET. REPEAT HFP IN 6MOS. OPV IN FEB 2018.

## 2016-01-15 NOTE — Telephone Encounter (Signed)
LMOM to call.

## 2016-01-15 NOTE — Telephone Encounter (Signed)
Pt is aware of results. 

## 2016-01-15 NOTE — Telephone Encounter (Signed)
ON RECALL  °

## 2016-02-13 ENCOUNTER — Ambulatory Visit: Payer: BC Managed Care – PPO | Admitting: Nurse Practitioner

## 2016-05-15 ENCOUNTER — Encounter: Payer: Self-pay | Admitting: Nurse Practitioner

## 2016-05-15 ENCOUNTER — Ambulatory Visit (INDEPENDENT_AMBULATORY_CARE_PROVIDER_SITE_OTHER): Payer: BC Managed Care – PPO | Admitting: Nurse Practitioner

## 2016-05-15 VITALS — BP 147/76 | HR 87 | Temp 98.3°F | Ht 65.0 in | Wt 186.2 lb

## 2016-05-15 DIAGNOSIS — Z1211 Encounter for screening for malignant neoplasm of colon: Secondary | ICD-10-CM

## 2016-05-15 DIAGNOSIS — R945 Abnormal results of liver function studies: Principal | ICD-10-CM

## 2016-05-15 DIAGNOSIS — R7989 Other specified abnormal findings of blood chemistry: Secondary | ICD-10-CM

## 2016-05-15 NOTE — Patient Instructions (Addendum)
1. Below is the website link for the [place] quit line to help you cut down and quit smoking:  [employer] 2.   I'm providing you information on fatty liver. 3.   Return for follow-up as needed.     Fatty Liver Fatty liver, also called hepatic steatosis or steatohepatitis, is a condition in which too much fat has built up in your liver cells. The liver removes harmful substances from your bloodstream. It produces fluids your body needs. It also helps your body use and store energy from the food you eat. In many cases, fatty liver does not cause symptoms or problems. It is often diagnosed when tests are being done for other reasons. However, over time, fatty liver can cause inflammation that may lead to more serious liver problems, such as scarring of the liver (cirrhosis). What are the causes? Causes of fatty liver may include:  Drinking too much alcohol.  Poor nutrition.  Obesity.  Cushing syndrome.  Diabetes.  Hyperlipidemia.  Pregnancy.  Certain drugs.  Poisons.  Some viral infections. What increases the risk? You may be more likely to develop fatty liver if you:  Abuse alcohol.  Are pregnant.  Are overweight.  Have diabetes.  Have hepatitis.  Have a high triglyceride level. What are the signs or symptoms? Fatty liver often does not cause any symptoms. In cases where symptoms develop, they can include:  Fatigue.  Weakness.  Weight loss.  Confusion.  Abdominal pain.  Yellowing of your skin and the white parts of your eyes (jaundice).  Nausea and vomiting. How is this diagnosed? Fatty liver may be diagnosed by:  Physical exam and medical history.  Blood tests.  Imaging tests, such as an ultrasound, CT scan, or MRI.  Liver biopsy. A small sample of liver tissue is removed using a needle. The sample is then looked at under a microscope. How is this treated? Fatty liver is often caused by other health conditions. Treatment for fatty  liver may involve medicines and lifestyle changes to manage conditions such as:  Alcoholism.  High cholesterol.  Diabetes.  Being overweight or obese. Follow these instructions at home:  Eat a healthy diet as directed by your health care provider.  Exercise regularly. This can help you lose weight and control your cholesterol and diabetes. Talk to your health care provider about an exercise plan and which activities are best for you.  Do not drink alcohol.  Take medicines only as directed by your health care provider. Contact a health care provider if: You have difficulty controlling your:  Blood sugar.  Cholesterol.  Alcohol consumption. Get help right away if:  You have abdominal pain.  You have jaundice.  You have nausea and vomiting. This information is not intended to replace advice given to you by your health care provider. Make sure you discuss any questions you have with your health care provider. Document Released: 04/19/2005 Document Revised: 08/10/2015 Document Reviewed: 07/14/2013 Elsevier Interactive Patient Education  2017 Else[employer] Inc[employer]    Nonalcoholic Fatty Liver Disease Diet Nonalcoholic fatty liver disease is a condition that causes fat to accumulate in and around the liver. The disease makes it harder for the liver to work the way that it should. Following a healthy diet can help to keep nonalcoholic fatty liver disease under control. It can also help to prevent or improve conditions that are associated with the disease, such as heart disease, diabetes, high blood pressure, and abnormal cholesterol levels. Along with regular exercise, this diet:  Promotes  weight loss.  Helps to control blood sugar levels.  Helps to improve the way that the body uses insulin. What do I need to know about this diet?  Use the glycemic index (GI) to plan your meals. The index tells you how quickly a food will raise your blood sugar. Choose low-GI foods. These foods take  a longer time to raise blood sugar.  Keep track of how many calories you take in. Eating the right amount of calories will help you to achieve a healthy weight.  You may want to follow a Mediterranean diet. This diet includes a lot of vegetables, lean meats or fish, whole grains, fruits, and healthy oils and fats. What foods can I eat? Grains  Whole grains, such as whole-wheat or whole-grain breads, crackers, tortillas, cereals, and pasta. Stone-ground whole wheat. Pumpernickel bread. Unsweetened oatmeal. Bulgur. Barley. Quinoa. Brown or wild rice. Corn or whole-wheat flour tortillas. Vegetables  Lettuce. Spinach. Peas. Beets. Cauliflower. Cabbage. Broccoli. Carrots. Tomatoes. Squash. Eggplant. Herbs. Peppers. Onions. Cucumbers. Brussels sprouts. Yams and sweet potatoes. Beans. Lentils. Fruits  Bananas. Apples. Oranges. Grapes. Papaya. Mango. Pomegranate. Kiwi. Grapefruit. Cherries. Meats and Other Protein Sources  Seafood and shellfish. Lean meats. Poultry. Tofu. Dairy  Low-fat or fat-free dairy products, such as yogurt, cottage cheese, and cheese. Beverages  Water. Sugar-free drinks. Tea. Coffee. Low-fat or skim milk. Milk alternatives, such as soy or almond milk. Real fruit juice. Condiments  Mustard. Relish. Low-fat, low-sugar ketchup and barbecue sauce. Low-fat or fat-free mayonnaise. Sweets and Desserts  Sugar-free sweets. Fats and Oils  Avocado. Canola or olive oil. Nuts and nut butters. Seeds. The items listed above may not be a complete list of recommended foods or beverages. Contact your dietitian for more options.  What foods are not recommended? Palm oil and coconut oil. Processed foods. Fried foods. Sweetened drinks, such as sweet tea, milkshakes, snow cones, iced sweet drinks, and sodas. Alcohol. Sweets. Foods that contain a lot of salt or sodium. The items listed above may not be a complete list of foods and beverages to avoid. Contact your dietitian for more information.   This information is not intended to replace advice given to you by your health care provider. Make sure you discuss any questions you have with your health care provider. Document Released: 07/19/2014 Document Revised: 08/10/2015 Document Reviewed: 03/29/2014 Elsevier Interactive Patient Education  2017 [employer].

## 2016-05-15 NOTE — Assessment & Plan Note (Signed)
Appetite is seen negative, autoimmune labs negative. No cirrhosis on ultrasound. Likely fatty liver. She does have hypertension, diabetes, and hyperlipidemia. Discussed the tenets of fatty liver treatment including diet and exercise, blood pressure control, lipid control, glycemic control. I will provide her with further education. Return for follow-up as needed.

## 2016-05-15 NOTE — Assessment & Plan Note (Signed)
Colonoscopy went well, multiple polyps all hyperplastic. Recommended 10 year repeat exam. Recall for this.

## 2016-05-15 NOTE — Progress Notes (Signed)
Referring Provider: Marjean Donna, MD Primary Care Physician:  Jani Gravel, MD Primary GI:  Dr. Oneida Alar  Chief Complaint  Patient presents with  . elevated LFT's    f/u, doing ok, had TCS 11/2015    HPI:   Anna Hale is a 61 y.o. female who presents follow-up and screening colonoscopy. Patient was last seen in our office 11/10/2015. At that time it was noted he never had a colonoscopy before. Phone triage deferred to office visit due to ALT 45 and creatinine 1.49. Generally asymptomatic from a GI standpoint. Noted history of diabetes and hypertension. Deemed likely fatty liver but due to her date of birth high risk for hepatitis C. Completed 1 time screening of hepatitis C and recheck of CMP. She was also arrange for colonoscopy. Persistent elevation of ALT, hepatitis C negative, creatinine remains elevated at 1.77 and recommended follow-up with primary care for this. Follow-up labs for autoimmune hepatitis or negative. Ultrasound of the abdomen noted mildly echogenic liver without focal lesions and no evidence for acute abnormality.  Colonoscopy was completed 11/24/2015 and found a single 8 mm polyp in the descending colon, four 2-4 mm polyps in the rectum, mucosal ulceration of the ileocecal valve most likely due to aspirin, mild diverticulosis in the sigmoid colon, internal hemorrhoids. Surgical pathology found: Ulcer due to aspirin use, polyps hyperplastic. Recommended weight loss. Colonoscopy in 10 years.  Today she states she's doing well. Colonoscopy went well (results reviewed). Denies abdominal pain, N/V, hematochezia, melena, fever, chills, unintentional weight loss, acute bowel habit changes. Denies chest pain, dyspnea, dizziness, lightheadedness, syncope, near syncope. Denies any other upper or lower GI symptoms.  Past Medical History:  Diagnosis Date  . Diabetes ([place])   . Hyperlipidemia   . Hypertension     Past Surgical History:  Procedure Laterality Date  .  CESAREAN SECTION    . COLONOSCOPY N/A 11/24/2015   Procedure: COLONOSCOPY;  Surgeon: Danie Binder, MD;  Location: [facility];  Service: Endoscopy;  Laterality: N/A;  2:45 pm    Current Outpatient Prescriptions  Medication Sig Dispense Refill  . Aspirin-Omeprazole (YOSPRALA) 81-40 MG TBEC Take by mouth.    Marland Kitchen atorvastatin (LIPITOR) 40 MG tablet Take 40 mg by mouth at bedtime.    . Cholecalciferol (VITAMIN D3) 2000 units TABS Take 1 tablet by mouth daily.    Marland Kitchen losartan-hydrochlorothiazide (HYZAAR) 100-25 MG tablet Take 1 tablet by mouth daily.    . metFORMIN (GLUCOPHAGE) 1000 MG tablet Take 500 mg by mouth 2 (two) times daily with a meal.      No current facility-administered medications for this visit.     Allergies as of 05/15/2016 - Review Complete 05/15/2016  Allergen Reaction Noted  . Compazine Other (See Comments) 05/04/2011    Family History  Problem Relation Age of Onset  . Colon cancer Neg Hx     Social History   Social History  . Marital status: Married    Spouse name: N/A  . Number of children: N/A  . Years of education: N/A   Social History Main Topics  . Smoking status: Current Every Day Smoker    Packs/day: 0.50    Years: 25.00    Types: Cigarettes  . Smokeless tobacco: Never Used  . Alcohol use No  . Drug use: No  . Sexual activity: Not Asked   Other Topics Concern  . None   Social History Narrative  . None    Review of Systems: Complete ROS negative  except as per HPI.   Physical Exam: BP (!) 147/76   Pulse 87   Temp 98.3 F (36.8 C) (Oral)   Ht 5\' 5"  (1.651 m)   Wt 186 lb 3.2 oz (84.5 kg)   BMI 30.99 kg/m  General:   Obese female. Alert and oriented. Pleasant and cooperative. Well-nourished and well-developed.  Eyes:  Without icterus, sclera clear and conjunctiva pink.  Ears:  Normal auditory acuity. Cardiovascular:  S1, S2 present without murmurs appreciated. Extremities without clubbing or edema. Respiratory:  Clear to auscultation  bilaterally. No wheezes, rales, or rhonchi. No distress.  Gastrointestinal:  +BS, rounded but soft, non-tender and non-distended. No HSM noted. No guarding or rebound. No masses appreciated.  Rectal:  Deferred  Musculoskalatal:  Symmetrical without gross deformities. Neurologic:  Alert and oriented x4;  grossly normal neurologically. Psych:  Alert and cooperative. Normal mood and affect. Heme/Lymph/Immune: No excessive bruising noted.    05/15/2016 9:22 AM   Disclaimer: This note was dictated with voice recognition software. Similar sounding words can inadvertently be transcribed and may not be corrected upon review.

## 2016-05-15 NOTE — Progress Notes (Signed)
cc'ed to pcp °

## 2016-05-20 ENCOUNTER — Telehealth: Payer: Self-pay | Admitting: Gastroenterology

## 2016-05-20 NOTE — Telephone Encounter (Signed)
Put in recall lab box for April.

## 2016-05-20 NOTE — Telephone Encounter (Signed)
Recall for repeat hfp

## 2016-05-21 ENCOUNTER — Other Ambulatory Visit: Payer: Self-pay

## 2016-05-21 DIAGNOSIS — R7989 Other specified abnormal findings of blood chemistry: Secondary | ICD-10-CM

## 2016-05-21 DIAGNOSIS — R945 Abnormal results of liver function studies: Principal | ICD-10-CM

## 2016-07-01 NOTE — Progress Notes (Signed)
REVIEWED. PT NEEDS OPV IN feb 2019 w/ slf e30 steatohepatitis.

## 2016-07-01 NOTE — Progress Notes (Signed)
ON RECALL FOR OV  °

## 2017-02-26 ENCOUNTER — Encounter: Payer: Self-pay | Admitting: Gastroenterology

## 2017-05-20 ENCOUNTER — Other Ambulatory Visit (HOSPITAL_COMMUNITY): Payer: Self-pay | Admitting: Family Medicine

## 2017-05-20 DIAGNOSIS — Z1231 Encounter for screening mammogram for malignant neoplasm of breast: Secondary | ICD-10-CM

## 2017-05-21 ENCOUNTER — Emergency Department (HOSPITAL_COMMUNITY): Payer: BC Managed Care – PPO

## 2017-05-21 ENCOUNTER — Encounter (HOSPITAL_COMMUNITY): Payer: Self-pay | Admitting: Emergency Medicine

## 2017-05-21 ENCOUNTER — Other Ambulatory Visit: Payer: Self-pay

## 2017-05-21 ENCOUNTER — Emergency Department (HOSPITAL_COMMUNITY)
Admission: EM | Admit: 2017-05-21 | Discharge: 2017-05-21 | Disposition: A | Discharge status: Home / Self Care | Payer: BC Managed Care – PPO | Attending: Emergency Medicine | Admitting: Emergency Medicine

## 2017-05-21 DIAGNOSIS — I10 Essential (primary) hypertension: Secondary | ICD-10-CM | POA: Insufficient documentation

## 2017-05-21 DIAGNOSIS — Z7984 Long term (current) use of oral hypoglycemic drugs: Secondary | ICD-10-CM | POA: Insufficient documentation

## 2017-05-21 DIAGNOSIS — Z79899 Other long term (current) drug therapy: Secondary | ICD-10-CM | POA: Diagnosis not present

## 2017-05-21 DIAGNOSIS — R42 Dizziness and giddiness: Secondary | ICD-10-CM | POA: Diagnosis present

## 2017-05-21 DIAGNOSIS — E119 Type 2 diabetes mellitus without complications: Secondary | ICD-10-CM | POA: Diagnosis not present

## 2017-05-21 DIAGNOSIS — F17210 Nicotine dependence, cigarettes, uncomplicated: Secondary | ICD-10-CM | POA: Diagnosis not present

## 2017-05-21 DIAGNOSIS — H81399 Other peripheral vertigo, unspecified ear: Secondary | ICD-10-CM | POA: Insufficient documentation

## 2017-05-21 LAB — BASIC METABOLIC PANEL
ANION GAP: 11 (ref 5–15)
BUN: 28 mg/dL — AB (ref 6–20)
CHLORIDE: 103 mmol/L (ref 101–111)
CO2: 23 mmol/L (ref 22–32)
Calcium: 9.3 mg/dL (ref 8.9–10.3)
Creatinine, Ser: 1.34 mg/dL — ABNORMAL HIGH (ref 0.44–1.00)
GFR calc Af Amer: 48 mL/min — ABNORMAL LOW (ref >60)
GFR, EST NON AFRICAN AMERICAN: 42 mL/min — AB (ref >60)
Glucose, Bld: 95 mg/dL (ref 65–99)
Potassium: 3.9 mmol/L (ref 3.5–5.1)
SODIUM: 137 mmol/L (ref 135–145)

## 2017-05-21 LAB — URINALYSIS, ROUTINE W REFLEX MICROSCOPIC
Bilirubin Urine: NEGATIVE
Glucose, UA: NEGATIVE mg/dL
HGB URINE DIPSTICK: NEGATIVE
KETONES UR: NEGATIVE mg/dL
LEUKOCYTES UA: NEGATIVE
Nitrite: NEGATIVE
PROTEIN: NEGATIVE mg/dL
Specific Gravity, Urine: 1.01 (ref 1.005–1.030)
pH: 6 (ref 5.0–8.0)

## 2017-05-21 LAB — TROPONIN I

## 2017-05-21 LAB — CBC WITH DIFFERENTIAL/PLATELET
BASOS ABS: 0 10*3/uL (ref 0.0–0.1)
BASOS PCT: 0 %
Eosinophils Absolute: 0.1 10*3/uL (ref 0.0–0.7)
Eosinophils Relative: 1 %
HCT: 38.5 % (ref 36.0–46.0)
Hemoglobin: 12.2 g/dL (ref 12.0–15.0)
LYMPHS PCT: 44 %
Lymphs Abs: 3.2 10*3/uL (ref 0.7–4.0)
MCH: 27.9 pg (ref 26.0–34.0)
MCHC: 31.7 g/dL (ref 30.0–36.0)
MCV: 87.9 fL (ref 78.0–100.0)
MONO ABS: 0.4 10*3/uL (ref 0.1–1.0)
Monocytes Relative: 5 %
NEUTROS ABS: 3.6 10*3/uL (ref 1.7–7.7)
Neutrophils Relative %: 50 %
PLATELETS: 285 10*3/uL (ref 150–400)
RBC: 4.38 MIL/uL (ref 3.87–5.11)
RDW: 14.3 % (ref 11.5–15.5)
WBC: 7.3 10*3/uL (ref 4.0–10.5)

## 2017-05-21 MED ORDER — MECLIZINE HCL 25 MG PO TABS: 25.0000 mg | ORAL_TABLET | Freq: Three times a day (TID) | ORAL | 0 refills | Status: AC | PRN

## 2017-05-21 NOTE — ED Notes (Signed)
Pt ambulated to the bathroom with ease and no assistance. Pt stated she felt a little lightheaded during ambulation but never dizzy

## 2017-05-21 NOTE — ED Notes (Signed)
Pt ambulated down hallway to bathroom with no difficulty noted

## 2017-05-21 NOTE — ED Notes (Signed)
Have given pt some crackers and pb

## 2017-05-21 NOTE — ED Notes (Signed)
Pt ambulated without distress to the bathroom.

## 2017-05-21 NOTE — ED Triage Notes (Signed)
Pt at work complaining of high blood pressure, with dizziness denies headache.

## 2017-05-21 NOTE — ED Notes (Signed)
Lab in room collecting blood work

## 2017-05-21 NOTE — ED Notes (Signed)
Gave EKG to Dr. Thurnell Garbe

## 2017-05-21 NOTE — ED Provider Notes (Signed)
[facility] EMERGENCY DEPARTMENT Provider Note   CSN: [id number] Arrival date & time: 05/21/17  1655     History   Chief Complaint Chief Complaint  Patient presents with  . Hypertension    HPI Anna Hale is a 62 y.o. female.  HPI  Pt was seen at 1115. Per pt, c/o gradual onset and resolution of several episodes of "dizziness" that began this morning. Pt states she was moving from sitting to standing when her symptoms began. Pt describes the dizziness as "everything is spinning around." Pt states she went to the office and had her BP checked; was told it "was high." Pt denies any further symptoms since arrival to the ED, other than "being hungry." Denies CP/palpitations, no SOB/cough, no abd pain, no N/V/D, no visual changes, no focal motor weakness, no tingling/numbness in extremities, no ataxia, no slurred speech, no facial droop.    Past Medical History:  Diagnosis Date  . Diabetes ([place])   . Hyperlipidemia   . Hypertension     Patient Active Problem List   Diagnosis Date Noted  . Elevated LFTs 11/10/2015  . Special screening for malignant neoplasms, colon 11/10/2015    Past Surgical History:  Procedure Laterality Date  . CESAREAN SECTION    . COLONOSCOPY N/A 11/24/2015   Procedure: COLONOSCOPY;  Surgeon: Danie Binder, MD;  [facility]: [facility];  Service: Endoscopy;  Laterality: N/A;  2:45 pm    OB History    No data available       Home Medications    Prior to Admission medications   Medication Sig Start Date End Date Taking? Authorizing Provider  atorvastatin (LIPITOR) 40 MG tablet Take 40 mg by mouth at bedtime.   Yes [provider]  Cholecalciferol (VITAMIN D3) 2000 units TABS Take 1 tablet by mouth daily.   Yes [provider]  losartan-hydrochlorothiazide (HYZAAR) 100-25 MG tablet Take 1 tablet by mouth daily.   Yes [provider]  metFORMIN (GLUCOPHAGE) 500 MG tablet Take 500 mg by mouth 2 (two) times daily with a  meal.    Yes [provider]    Family History Family History  Problem Relation Age of Onset  . Colon cancer Neg Hx     Social History Social History   Tobacco Use  . Smoking status: Current Every Day Smoker    Packs/day: 0.50    Years: 25.00    Pack years: 12.50    Types: Cigarettes  . Smokeless tobacco: Never Used  Substance Use Topics  . Alcohol use: No  . Drug use: No     Allergies   Compazine   Review of Systems Review of Systems ROS: Statement: All systems negative except as marked or noted in the HPI; Constitutional: Negative for fever and chills. ; ; Eyes: Negative for eye pain, redness and discharge. ; ; ENMT: Negative for ear pain, hoarseness, nasal congestion, sinus pressure and sore throat. ; ; Cardiovascular: Negative for chest pain, palpitations, diaphoresis, dyspnea and peripheral edema. ; ; Respiratory: Negative for cough, wheezing and stridor. ; ; Gastrointestinal: Negative for nausea, vomiting, diarrhea, abdominal pain, blood in stool, hematemesis, jaundice and rectal bleeding. . ; ; Genitourinary: Negative for dysuria, flank pain and hematuria. ; ; Musculoskeletal: Negative for back pain and neck pain. Negative for swelling and trauma.; ; Skin: Negative for pruritus, rash, abrasions, blisters, bruising and skin lesion.; ; Neuro: +"spinning." Negative for headache, lightheadedness and neck stiffness. Negative for weakness, altered level of consciousness, altered  mental status, extremity weakness, paresthesias, involuntary movement, seizure and syncope.       Physical Exam Updated Vital Signs BP 127/66   Pulse 72   Temp 98.2 F (36.8 C) (Oral)   Resp 16   SpO2 97%    11:38 Orthostatic Vital Signs HC  Orthostatic Lying   BP- Lying: 147/75  Pulse- Lying: 73      Orthostatic Sitting  BP- Sitting: 164/86  Pulse- Sitting: 75      Orthostatic Standing at 0 minutes  BP- Standing at 0 minutes: 137/70  Pulse- Standing at 0 minutes: 77    Patient Vitals for the past 24 hrs:  BP Temp Temp src Pulse Resp SpO2  05/21/17 1546 133/65 - - 78 18 99 %  05/21/17 1430 117/64 - - 70 18 100 %  05/21/17 1330 127/66 - - 72 16 97 %  05/21/17 1200 139/66 - - 71 11 100 %  05/21/17 1115 133/67 - - 67 17 99 %  05/21/17 0945 (!) 182/83 98.2 F (36.8 C) Oral 73 18 100 %     Physical Exam  1120: Physical examination:  Nursing notes reviewed; Vital signs and O2 SAT reviewed;  Constitutional: Well developed, Well nourished, Well hydrated, In no acute distress; Head:  Normocephalic, atraumatic; Eyes: EOMI, PERRL, No scleral icterus; ENMT: Mouth and pharynx normal, Mucous membranes moist; Neck: Supple, Full range of motion, No lymphadenopathy; Cardiovascular: Regular rate and rhythm, No gallop; Respiratory: Breath sounds clear & equal bilaterally, No wheezes. Speaking full sentences with ease, Normal respiratory effort/excursion; Chest: Nontender, Movement normal; Abdomen: Soft, Nontender, Nondistended, Normal bowel sounds; Genitourinary: No CVA tenderness; Extremities: Pulses normal, No tenderness, No edema, No calf edema or asymmetry.; Neuro: AA&Ox3, Major CN grossly intact. Speech clear.  No facial droop. +right horizontal end gaze fatigable nystagmus. Grips equal. Strength 5/5 equal bilat UE's and LE's.  DTR 2/4 equal bilat UE's and LE's.  No gross sensory deficits.  Normal cerebellar testing bilat UE's (finger-nose) and LE's (heel-shin). Climbs on and off stretcher easily by herself. Gait steady.; Skin: Color normal, Warm, Dry.    ED Treatments / Results  Labs (all labs ordered are listed, but only abnormal results are displayed)   EKG  EKG Interpretation  Date/Time:  Wednesday May 21 2017 11:37:10 EST Ventricular Rate:  68 PR Interval:    QRS Duration: 79 QT Interval:  388 QTC Calculation: 413 R Axis:   66 Text Interpretation:  Sinus rhythm Probable left atrial enlargement Baseline wander No old tracing to compare Confirmed by  Francine Graven [phone]) on 05/21/2017 12:46:58 PM       EKG Interpretation  Date/Time:  Wednesday May 21 2017 12:48:23 EST Ventricular Rate:  79 PR Interval:    QRS Duration: 86 QT Interval:  373 QTC Calculation: 428 R Axis:   63 Text Interpretation:  Sinus rhythm Probable left atrial enlargement Since last tracing of earlier today No significant change was found Confirmed by Francine Graven [phone]) on 05/21/2017 2:14:30 PM         Radiology   Procedures Procedures (including critical care time)  Medications Ordered in ED Medications - No data to display   Initial Impression / Assessment and Plan / ED Course  I have reviewed the triage vital signs and the nursing notes.  Pertinent labs & imaging results that were available during my care of the patient were reviewed by me and considered in my medical decision making (see chart for details).  MDM Reviewed: previous chart, nursing  note and vitals Reviewed previous: labs and ECG Interpretation: labs, x-ray, CT scan and ECG   Results for orders placed or performed during the hospital encounter of 61/60/73  Basic metabolic panel  Result Value Ref Range   Sodium 137 135 - 145 mmol/L   Potassium 3.9 3.5 - 5.1 mmol/L   Chloride 103 101 - 111 mmol/L   CO2 23 22 - 32 mmol/L   Glucose, Bld 95 65 - 99 mg/dL   BUN 28 (H) 6 - 20 mg/dL   Creatinine, Ser 1.34 (H) 0.44 - 1.00 mg/dL   Calcium 9.3 8.9 - 10.3 mg/dL   GFR calc non Af Amer 42 (L) >60 mL/min   GFR calc Af Amer 48 (L) >60 mL/min   Anion gap 11 5 - 15  Troponin I  Result Value Ref Range   Troponin I <0.03 <0.03 ng/mL  CBC with Differential  Result Value Ref Range   WBC 7.3 4.0 - 10.5 K/uL   RBC 4.38 3.87 - 5.11 MIL/uL   Hemoglobin 12.2 12.0 - 15.0 g/dL   HCT 38.5 36.0 - 46.0 %   MCV 87.9 78.0 - 100.0 fL   MCH 27.9 26.0 - 34.0 pg   MCHC 31.7 30.0 - 36.0 g/dL   RDW 14.3 11.5 - 15.5 %   Platelets 285 150 - 400 K/uL   Neutrophils Relative % 50 %   Neutro  Abs 3.6 1.7 - 7.7 K/uL   Lymphocytes Relative 44 %   Lymphs Abs 3.2 0.7 - 4.0 K/uL   Monocytes Relative 5 %   Monocytes Absolute 0.4 0.1 - 1.0 K/uL   Eosinophils Relative 1 %   Eosinophils Absolute 0.1 0.0 - 0.7 K/uL   Basophils Relative 0 %   Basophils Absolute 0.0 0.0 - 0.1 K/uL  Urinalysis, Routine w reflex microscopic  Result Value Ref Range   Color, Urine STRAW (A) YELLOW   APPearance CLEAR CLEAR   Specific Gravity, Urine 1.010 1.005 - 1.030   pH 6.0 5.0 - 8.0   Glucose, UA NEGATIVE NEGATIVE mg/dL   Hgb urine dipstick NEGATIVE NEGATIVE   Bilirubin Urine NEGATIVE NEGATIVE   Ketones, ur NEGATIVE NEGATIVE mg/dL   Protein, ur NEGATIVE NEGATIVE mg/dL   Nitrite NEGATIVE NEGATIVE   Leukocytes, UA NEGATIVE NEGATIVE  Troponin I  Result Value Ref Range   Troponin I <0.03 <0.03 ng/mL   Dg Chest 2 View Result Date: 05/21/2017 CLINICAL DATA:  Dizziness EXAM: CHEST - 2 VIEW COMPARISON:  02/13/2010 FINDINGS: The heart size and mediastinal contours are within normal limits. Both lungs are clear. The visualized skeletal structures are unremarkable. IMPRESSION: No active cardiopulmonary disease. Electronically Signed   By: Kathreen Devoid   On: 05/21/2017 13:19   Ct Head Wo Contrast Result Date: 05/21/2017 CLINICAL DATA:  Elevated blood pressure and dizziness. EXAM: CT HEAD WITHOUT CONTRAST TECHNIQUE: Contiguous axial images were obtained from the base of the skull through the vertex without intravenous contrast. COMPARISON:  None FINDINGS: Brain: No evidence of acute infarction, hemorrhage, hydrocephalus, extra-axial collection or mass lesion/mass effect. Vascular: No hyperdense vessel or unexpected calcification. Skull: Normal. Negative for fracture or focal lesion. Sinuses/Orbits: No acute finding. Other: None. IMPRESSION: Negative exam.  Normal brain Electronically Signed   By: Kerby Moors M.D.   On: 05/21/2017 13:11    1540:  Pt not orthostatic on VS. Pt has ambulated with steady gait, easy  resps, NAD. Neuro exam remains intact/unchanged. Denies CP/SOB now or at any time. Pt initially agitated  and demanding something to eat. Pt given meal and ate without difficulty. Pt states she is ready to go home now. BUN/Cr per baseline. Workup reassuring. No clear indication for admission at this time. Pt requesting a work note for tomorrow. Dx and testing d/w pt.  Questions answered.  Verb understanding, agreeable to d/c home with outpt f/u.   Final Clinical Impressions(s) / ED Diagnoses   Final diagnoses:  None    ED Discharge Orders    None       Francine Graven, DO 05/22/17 2232

## 2017-05-21 NOTE — Discharge Instructions (Signed)
Take the prescription as directed.  Call your regular medical doctor tomorrow to schedule a follow up appointment within the next 3 days. Call the ENT doctor tomorrow to schedule a follow up appointment within the next week.  Return to the Emergency Department immediately sooner if worsening.

## 2017-05-27 ENCOUNTER — Encounter: Payer: Self-pay | Admitting: *Deleted

## 2017-06-12 ENCOUNTER — Ambulatory Visit (HOSPITAL_COMMUNITY): Payer: BC Managed Care – PPO

## 2017-06-19 ENCOUNTER — Ambulatory Visit (HOSPITAL_COMMUNITY)
Admission: RE | Admit: 2017-06-19 | Discharge: 2017-06-19 | Disposition: A | Discharge status: Home / Self Care | Payer: BC Managed Care – PPO | Source: Ambulatory Visit | Attending: Family Medicine | Admitting: Family Medicine

## 2017-06-19 DIAGNOSIS — Z1231 Encounter for screening mammogram for malignant neoplasm of breast: Secondary | ICD-10-CM | POA: Insufficient documentation

## 2017-06-30 ENCOUNTER — Ambulatory Visit (INDEPENDENT_AMBULATORY_CARE_PROVIDER_SITE_OTHER): Payer: BC Managed Care – PPO | Admitting: Obstetrics and Gynecology

## 2017-06-30 ENCOUNTER — Encounter: Payer: Self-pay | Admitting: Obstetrics and Gynecology

## 2017-06-30 ENCOUNTER — Other Ambulatory Visit (HOSPITAL_COMMUNITY)
Admission: RE | Admit: 2017-06-30 | Discharge: 2017-06-30 | Disposition: A | Discharge status: Home / Self Care | Payer: BC Managed Care – PPO | Source: Ambulatory Visit | Attending: Obstetrics and Gynecology | Admitting: Obstetrics and Gynecology

## 2017-06-30 VITALS — BP 130/70 | HR 74 | Ht 65.0 in | Wt 192.0 lb

## 2017-06-30 DIAGNOSIS — Z01419 Encounter for gynecological examination (general) (routine) without abnormal findings: Secondary | ICD-10-CM | POA: Insufficient documentation

## 2017-06-30 NOTE — Progress Notes (Signed)
  Assessment:  Annual Gyn Exam  Plan:  1. pap smear done, next pap due in 3 years 2. return annually or prn 3    Annual mammogram advised after age 62 Subjective:  Anna Hale is a 62 y.o. female G1P1001 who presents for annual exam. No LMP recorded. Patient is postmenopausal. The patient has no acute symptoms or complaints today. She had a mammogram at [facility] last week. She seldomly does self breast exams at home. She is unsure of when her last pap smear was. She had a colonoscopy around 2 years ago  The following portions of the patient's history were reviewed and updated as appropriate: allergies, current medications, past family history, past medical history, past social history, past surgical history and problem list. Past Medical History:  Diagnosis Date  . Diabetes ([place])   . Hyperlipidemia   . Hypertension     Past Surgical History:  Procedure Laterality Date  . CESAREAN SECTION    . COLONOSCOPY N/A 11/24/2015   Procedure: COLONOSCOPY;  Surgeon: Danie Binder, MD;  Location: [facility];  Service: Endoscopy;  Laterality: N/A;  2:45 pm     Current Outpatient Medications:  .  atorvastatin (LIPITOR) 40 MG tablet, Take 40 mg by mouth at bedtime., Disp: , Rfl:  .  Cholecalciferol (VITAMIN D3) 2000 units TABS, Take 1 tablet by mouth daily., Disp: , Rfl:  .  losartan-hydrochlorothiazide (HYZAAR) 100-25 MG tablet, Take 1 tablet by mouth daily., Disp: , Rfl:  .  metFORMIN (GLUCOPHAGE) 500 MG tablet, Take 500 mg by mouth 2 (two) times daily with a meal. , Disp: , Rfl:  .  meclizine (ANTIVERT) 25 MG tablet, Take 1 tablet (25 mg total) by mouth 3 (three) times daily as needed for dizziness. (Patient not taking: Reported on 06/30/2017), Disp: 15 tablet, Rfl: 0  Review of Systems Constitutional: negative Gastrointestinal: negative Genitourinary: negative  Objective:  BP 130/70 (BP Location: Left Arm, Patient Position: Sitting, Cuff Size: Large)   Pulse 74   Ht 5\' 5"  (1.651 m)    Wt 192 lb (87.1 kg)   BMI 31.95 kg/m    BMI: Body mass index is 31.95 kg/m.  General Appearance: Alert, appropriate appearance for age. No acute distress HEENT: Grossly normal Neck / Thyroid:  Cardiovascular:  Lungs: Back:  Breast Exam: No masses or nodes.No dimpling, nipple retraction or discharge. Gastrointestinal: Soft, non-tender, no masses or organomegaly Pelvic Exam:  External genitalia: normal general appearance Vaginal: normal mucosa without prolapse or lesions Cervix: normal appearance Adnexa: normal bimanual exam Uterus: normal single, nontender Rectovaginal: normal rectal, no masses and guaiac negative stool obtained Lymphatic Exam: Non-palpable nodes in neck, clavicular, axillary, or inguinal regions  Skin: no rash or abnormalities Neurologic: Normal gait and speech, no tremor  Psychiatric: Alert and oriented, appropriate affect.  Urinalysis:Not done   By signing my name below, I, Izna Ahmed, attest that this documentation has been prepared under the direction and in the presence of Jonnie Kind, MD. Electronically Signed: Jabier Gauss, Medical Scribe. 06/30/17. 9:20 AM.  I personally performed the services described in this documentation, which was SCRIBED in my presence. The recorded information has been reviewed and considered accurate. It has been edited as necessary during review. Jonnie Kind, MD

## 2017-07-01 LAB — CYTOLOGY - PAP
DIAGNOSIS: NEGATIVE
HPV (WINDOPATH): NOT DETECTED

## 2017-07-07 ENCOUNTER — Telehealth: Payer: Self-pay | Admitting: *Deleted

## 2017-07-07 NOTE — Telephone Encounter (Signed)
LMOVM that pap was normal and can repeat in 5 years per Dr Glo Herring.  Advised to call if she had further questions.

## 2018-07-30 ENCOUNTER — Other Ambulatory Visit (HOSPITAL_COMMUNITY): Payer: Self-pay | Admitting: Family Medicine

## 2018-07-30 DIAGNOSIS — Z1231 Encounter for screening mammogram for malignant neoplasm of breast: Secondary | ICD-10-CM

## 2018-08-12 ENCOUNTER — Ambulatory Visit (HOSPITAL_COMMUNITY)
Admission: RE | Admit: 2018-08-12 | Discharge: 2018-08-12 | Disposition: A | Discharge status: Home / Self Care | Payer: BC Managed Care – PPO | Source: Ambulatory Visit | Attending: Family Medicine | Admitting: Family Medicine

## 2018-08-12 ENCOUNTER — Other Ambulatory Visit: Payer: Self-pay

## 2018-08-12 DIAGNOSIS — Z1231 Encounter for screening mammogram for malignant neoplasm of breast: Secondary | ICD-10-CM | POA: Diagnosis not present

## 2019-02-09 ENCOUNTER — Other Ambulatory Visit: Payer: Self-pay

## 2019-02-09 DIAGNOSIS — Z20822 Contact with and (suspected) exposure to covid-19: Secondary | ICD-10-CM

## 2019-02-10 LAB — NOVEL CORONAVIRUS, NAA: SARS-CoV-2, NAA: NOT DETECTED

## 2019-02-15 ENCOUNTER — Telehealth: Payer: Self-pay | Admitting: *Deleted

## 2019-02-15 NOTE — Telephone Encounter (Signed)
Pt notified that she is negative for covid-19 test result.

## 2019-02-17 ENCOUNTER — Other Ambulatory Visit: Payer: Self-pay

## 2019-02-17 DIAGNOSIS — Z20822 Contact with and (suspected) exposure to covid-19: Secondary | ICD-10-CM

## 2019-02-19 ENCOUNTER — Telehealth: Payer: Self-pay | Admitting: *Deleted

## 2019-02-19 LAB — NOVEL CORONAVIRUS, NAA: SARS-CoV-2, NAA: NOT DETECTED

## 2019-02-19 NOTE — Telephone Encounter (Signed)
Patient given negative covis results.

## 2019-03-30 ENCOUNTER — Telehealth: Payer: Self-pay | Admitting: *Deleted

## 2019-03-30 NOTE — Telephone Encounter (Signed)
Pt called because she would like her COVID result be faxed to [phone] ATTN: Audree Camel; information repeated for accurracy.

## 2021-01-11 ENCOUNTER — Other Ambulatory Visit (HOSPITAL_COMMUNITY): Payer: Self-pay | Admitting: Adult Health

## 2021-01-11 DIAGNOSIS — Z1231 Encounter for screening mammogram for malignant neoplasm of breast: Secondary | ICD-10-CM

## 2021-01-26 ENCOUNTER — Ambulatory Visit (HOSPITAL_COMMUNITY)
Admission: RE | Admit: 2021-01-26 | Discharge: 2021-01-26 | Disposition: A | Discharge status: Home / Self Care | Payer: BC Managed Care – PPO | Source: Ambulatory Visit | Attending: Adult Health | Admitting: Adult Health

## 2021-01-26 ENCOUNTER — Other Ambulatory Visit: Payer: Self-pay

## 2021-01-26 DIAGNOSIS — Z1231 Encounter for screening mammogram for malignant neoplasm of breast: Secondary | ICD-10-CM | POA: Insufficient documentation

## 2021-03-06 ENCOUNTER — Other Ambulatory Visit (HOSPITAL_COMMUNITY)
Admission: RE | Admit: 2021-03-06 | Discharge: 2021-03-06 | Disposition: A | Discharge status: Home / Self Care | Payer: BC Managed Care – PPO | Source: Ambulatory Visit | Attending: Obstetrics & Gynecology | Admitting: Obstetrics & Gynecology

## 2021-03-06 ENCOUNTER — Other Ambulatory Visit: Payer: Self-pay

## 2021-03-06 ENCOUNTER — Ambulatory Visit (INDEPENDENT_AMBULATORY_CARE_PROVIDER_SITE_OTHER): Payer: BC Managed Care – PPO | Admitting: Obstetrics & Gynecology

## 2021-03-06 ENCOUNTER — Encounter: Payer: Self-pay | Admitting: Obstetrics & Gynecology

## 2021-03-06 VITALS — BP 147/73 | HR 92 | Ht 65.0 in | Wt 200.0 lb

## 2021-03-06 DIAGNOSIS — Z1211 Encounter for screening for malignant neoplasm of colon: Secondary | ICD-10-CM

## 2021-03-06 DIAGNOSIS — Z01419 Encounter for gynecological examination (general) (routine) without abnormal findings: Secondary | ICD-10-CM | POA: Insufficient documentation

## 2021-03-06 DIAGNOSIS — Z1212 Encounter for screening for malignant neoplasm of rectum: Secondary | ICD-10-CM

## 2021-03-06 NOTE — Progress Notes (Signed)
Subjective:     Anna Hale is a 65 y.o. female here for a routine exam.  No LMP recorded. Patient is postmenopausal. G1P1001 Birth Control Method:  menopausal Menstrual Calendar(currently): amenorrheic  Current complaints: none.   Current acute medical issues:  DM/HTN/Hyperlipidemia   Recent Gynecologic History No LMP recorded. Patient is postmenopausal. Last Pap: 06/2017,  normal Last mammogram: 11/22,  normal  Past Medical History:  Diagnosis Date   Diabetes ([place])    Hyperlipidemia    Hypertension     Past Surgical History:  Procedure Laterality Date   CESAREAN SECTION     COLONOSCOPY N/A 11/24/2015   Procedure: COLONOSCOPY;  Surgeon: Danie Binder, MD;  [facility];  Service: Endoscopy;  Laterality: N/A;  2:45 pm    OB History     Gravida  1   Para  1   Term  1   Preterm      AB      Living  1      SAB      IAB      Ectopic      Multiple      Live Births              Social History   Socioeconomic History   Marital status: Married    Spouse name: Not on file   Number of children: Not on file   Years of education: Not on file   Highest education level: Not on file  Occupational History   Not on file  Tobacco Use   Smoking status: Every Day    Packs/day: 0.50    Years: 25.00    Pack years: 12.50    Types: Cigarettes   Smokeless tobacco: Never  Vaping Use   Vaping Use: Never used  Substance and Sexual Activity   Alcohol use: No   Drug use: No   Sexual activity: Yes    Birth control/protection: None  Other Topics Concern   Not on file  Social History Narrative   Not on file   Social Determinants of Health   Financial Resource Strain: Not on file  Food Insecurity: Not on file  Transportation Needs: Not on file  Physical Activity: Not on file  Stress: Not on file  Social Connections: Not on file    Family History  Problem Relation Age of Onset   Stroke Paternal Grandfather    Diabetes Father     Hypertension Father    Diabetes Mother    Hypertension Mother    Hypertension Sister    Colon cancer Neg Hx      Current Outpatient Medications:    amLODipine (NORVASC) 10 MG tablet, Take 10 mg by mouth daily., Disp: , Rfl:    atorvastatin (LIPITOR) 40 MG tablet, Take 40 mg by mouth at bedtime., Disp: , Rfl:    Cholecalciferol (VITAMIN D3) 2000 units TABS, Take 1 tablet by mouth daily., Disp: , Rfl:    losartan (COZAAR) 100 MG tablet, Take 1 tablet by mouth daily., Disp: , Rfl:    metFORMIN (GLUCOPHAGE) 1000 MG tablet, Take 1,000 mg by mouth 2 (two) times daily., Disp: , Rfl:    losartan-hydrochlorothiazide (HYZAAR) 100-25 MG tablet, Take 1 tablet by mouth daily. (Patient not taking: Reported on 03/06/2021), Disp: , Rfl:    meclizine (ANTIVERT) 25 MG tablet, Take 1 tablet (25 mg total) by mouth 3 (three) times daily as needed for dizziness. (Patient not taking: Reported on 06/30/2017), Disp: 15 tablet,  Rfl: 0   metFORMIN (GLUCOPHAGE) 500 MG tablet, Take 500 mg by mouth 2 (two) times daily with a meal.  (Patient not taking: Reported on 03/06/2021), Disp: , Rfl:   Review of Systems  Review of Systems  Constitutional: Negative for fever, chills, weight loss, malaise/fatigue and diaphoresis.  HENT: Negative for hearing loss, ear pain, nosebleeds, congestion, sore throat, neck pain, tinnitus and ear discharge.   Eyes: Negative for blurred vision, double vision, photophobia, pain, discharge and redness.  Respiratory: Negative for cough, hemoptysis, sputum production, shortness of breath, wheezing and stridor.   Cardiovascular: Negative for chest pain, palpitations, orthopnea, claudication, leg swelling and PND.  Gastrointestinal: negative for abdominal pain. Negative for heartburn, nausea, vomiting, diarrhea, constipation, blood in stool and melena.  Genitourinary: Negative for dysuria, urgency, frequency, hematuria and flank pain.  Musculoskeletal: Negative for myalgias, back pain, joint pain  and falls.  Skin: Negative for itching and rash.  Neurological: Negative for dizziness, tingling, tremors, sensory change, speech change, focal weakness, seizures, loss of consciousness, weakness and headaches.  Endo/Heme/Allergies: Negative for environmental allergies and polydipsia. Does not bruise/bleed easily.  Psychiatric/Behavioral: Negative for depression, suicidal ideas, hallucinations, memory loss and substance abuse. The patient is not nervous/anxious and does not have insomnia.        Objective:  Blood pressure (!) 147/73, pulse 92, height 5\' 5"  (1.651 m), weight 200 lb (90.7 kg).   Physical Exam  Vitals reviewed. Constitutional: She is oriented to person, place, and time. She appears well-developed and well-nourished.  HENT:  Head: Normocephalic and atraumatic.        Right Ear: External ear normal.  Left Ear: External ear normal.  Nose: Nose normal.  Mouth/Throat: Oropharynx is clear and moist.  Eyes: Conjunctivae and EOM are normal. Pupils are equal, round, and reactive to light. Right eye exhibits no discharge. Left eye exhibits no discharge. No scleral icterus.  Neck: Normal range of motion. Neck supple. No tracheal deviation present. No thyromegaly present.  Cardiovascular: Normal rate, regular rhythm, normal heart sounds and intact distal pulses.  Exam reveals no gallop and no friction rub.   No murmur heard. Respiratory: Effort normal and breath sounds normal. No respiratory distress. She has no wheezes. She has no rales. She exhibits no tenderness.  GI: Soft. Bowel sounds are normal. She exhibits no distension and no mass. There is no tenderness. There is no rebound and no guarding.  Genitourinary:  Breasts no masses skin changes or nipple changes bilaterally      Vulva is normal without lesions Vagina is pink moist without discharge Cervix normal in appearance and pap is done Uterus is normal size shape and contour Adnexa is negative with normal sized ovaries   {Rectal    hemoccult negative, normal tone, no masses  Musculoskeletal: Normal range of motion. She exhibits no edema and no tenderness.  Neurological: She is alert and oriented to person, place, and time. She has normal reflexes. She displays normal reflexes. No cranial nerve deficit. She exhibits normal muscle tone. Coordination normal.  Skin: Skin is warm and dry. No rash noted. No erythema. No pallor.  Psychiatric: She has a normal mood and affect. Her behavior is normal. Judgment and thought content normal.       Medications Ordered at today's visit: No orders of the defined types were placed in this encounter.   Other orders placed at today's visit: No orders of the defined types were placed in this encounter.     Assessment:  Normal Gyn exam.    Plan:    Follow up in: 3 years.     Return in about 3 years (around 03/06/2024) for yearly.

## 2021-03-08 LAB — CYTOLOGY - PAP
Comment: NEGATIVE
Diagnosis: NEGATIVE
High risk HPV: NEGATIVE

## 2022-06-24 IMAGING — MG MM DIGITAL SCREENING BILAT W/ TOMO AND CAD
6 of 10 series · 6 of 30 positions shown · non-contrast
Comparison: Previous exam(s).

CLINICAL DATA: Screening.

EXAM:
DIGITAL SCREENING BILATERAL MAMMOGRAM WITH TOMOSYNTHESIS AND CAD
TECHNIQUE: Bilateral screening digital craniocaudal and mediolateral oblique
mammograms were obtained. Bilateral screening digital breast
tomosynthesis was performed. The images were evaluated with
computer-aided detection.

[L MLO synth-2D (1 of 2)]
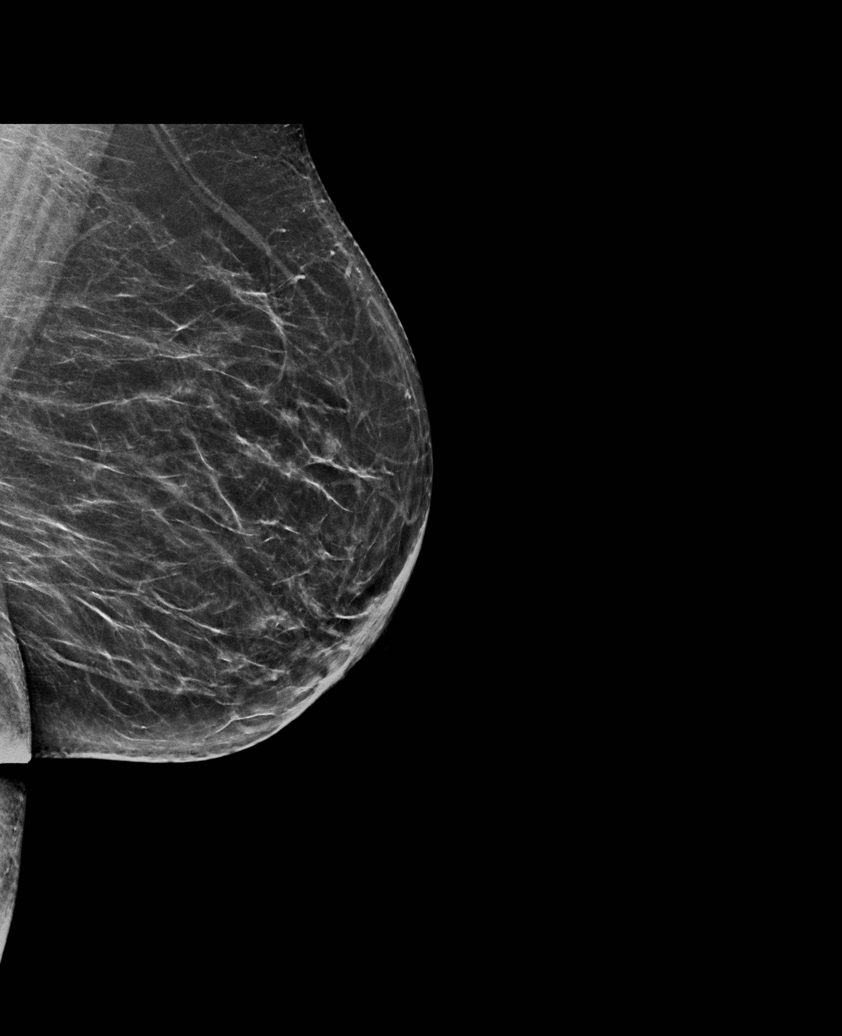

[L MLO synth-2D (2 of 2)]
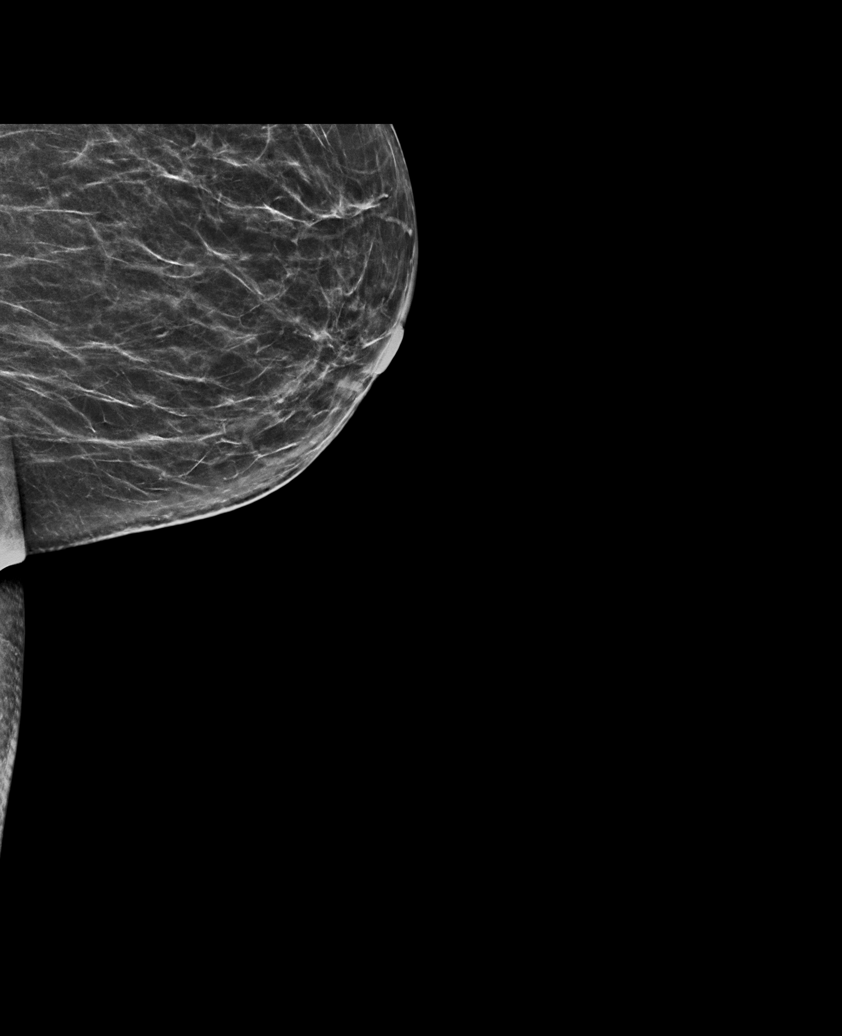

[R CC synth-2D]
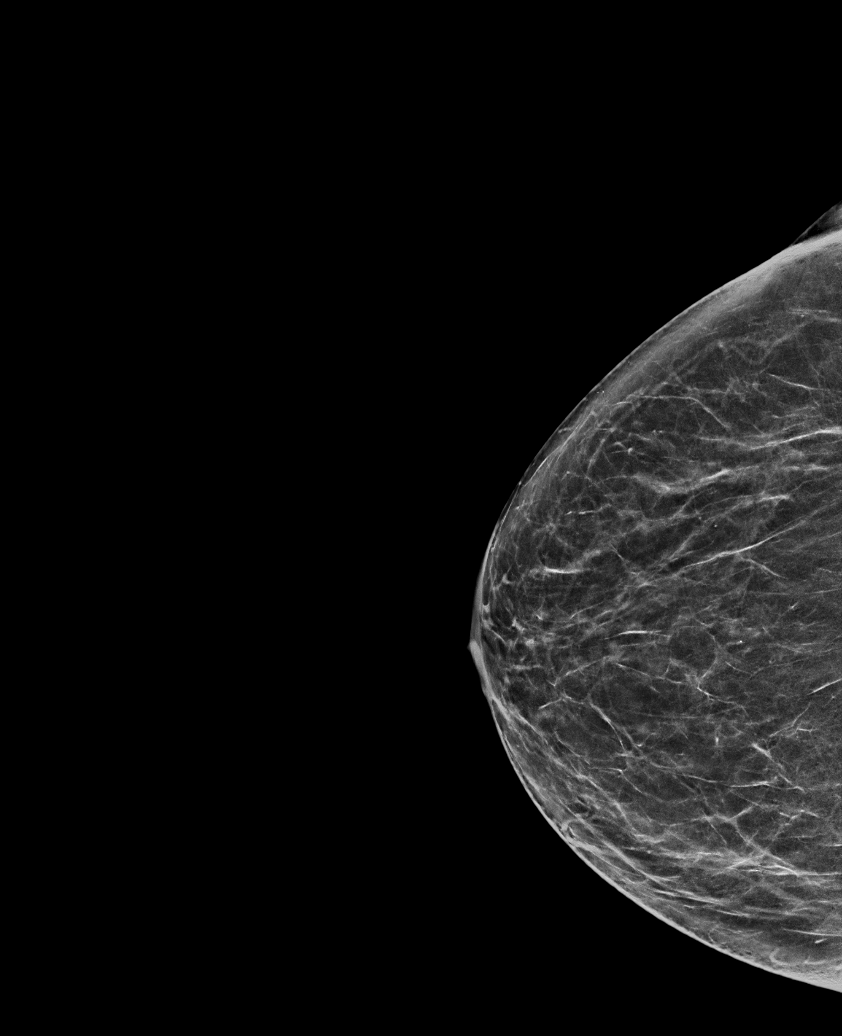

[R MLO synth-2D]
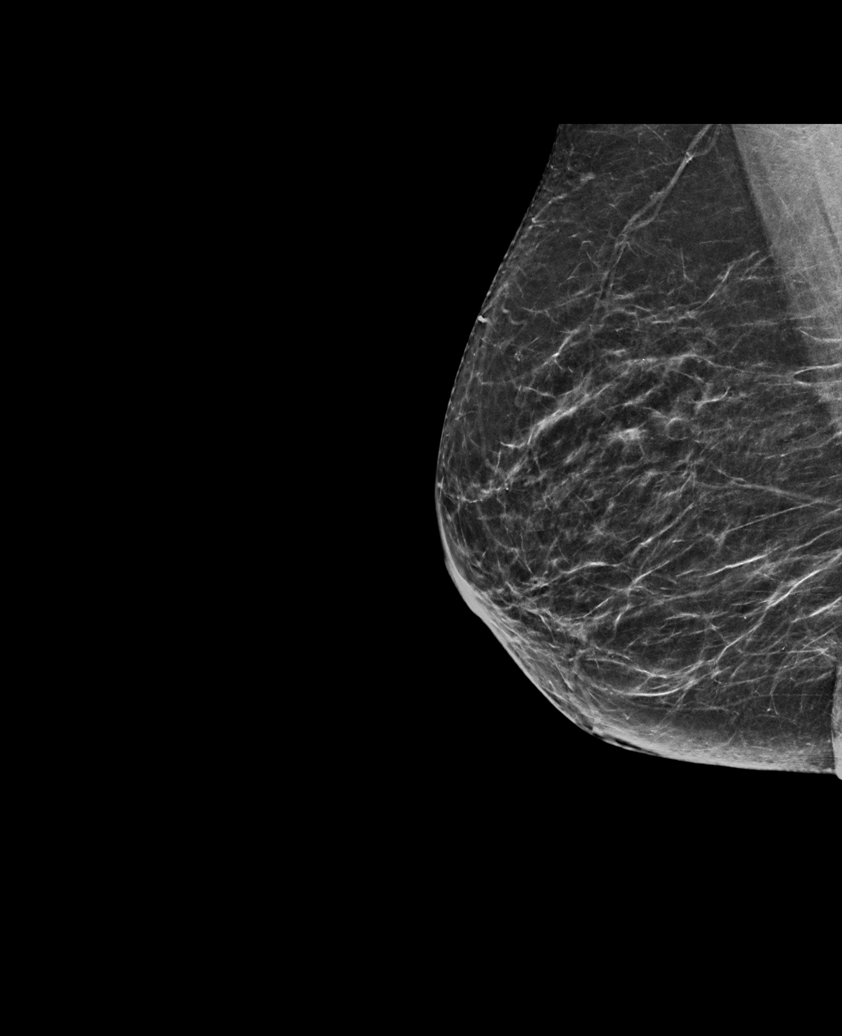

[L CC synth-2D]
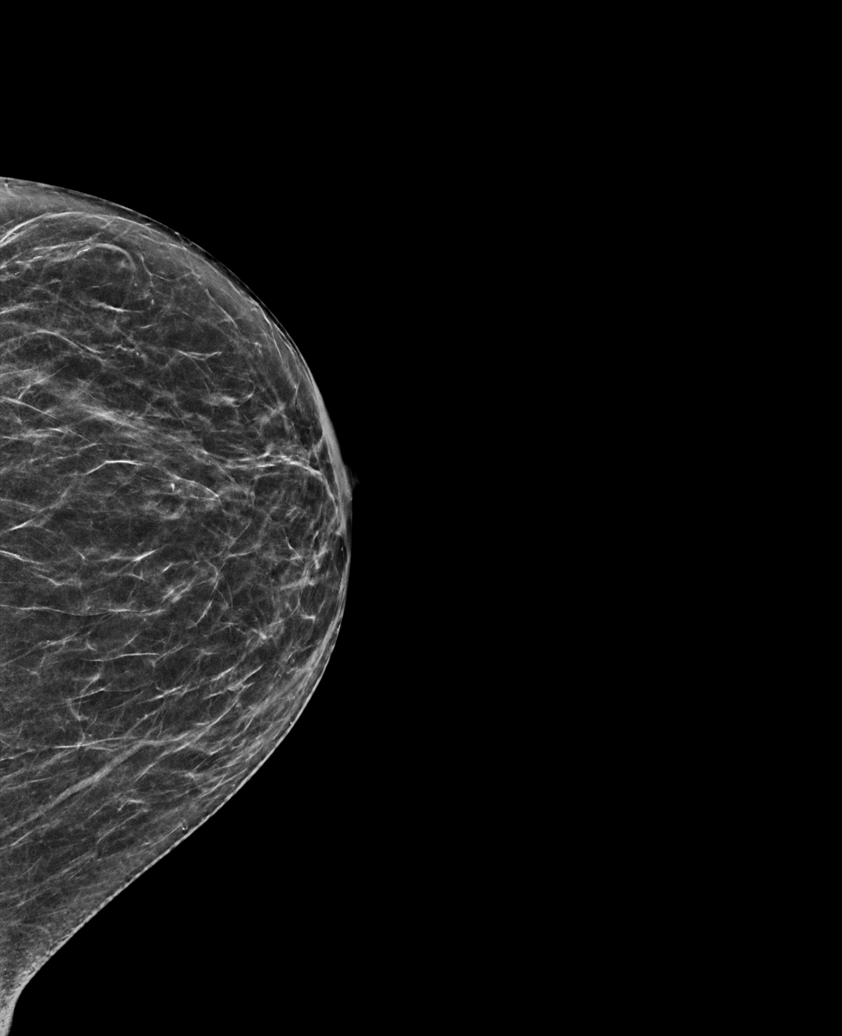

[L MLO tomo · tomo slice 37/74.0]
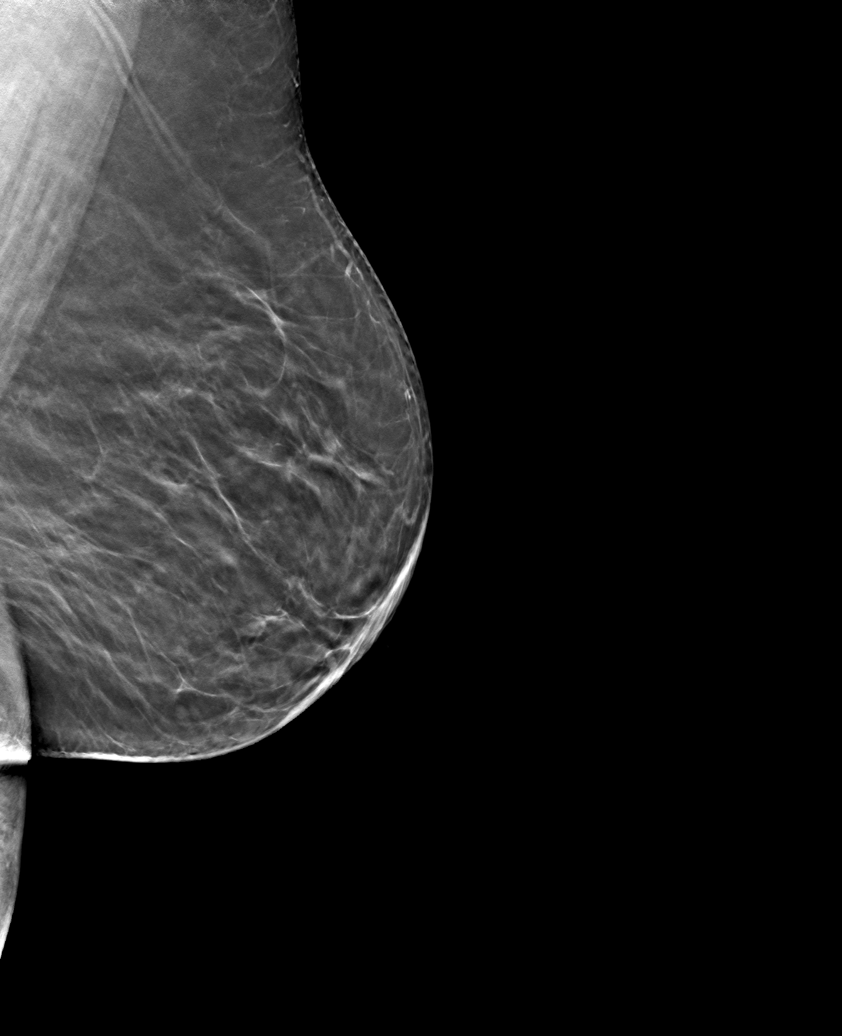

[6 of 30 positions shown; findings below may reference images not displayed]

ACR Breast Density Category b: There are scattered areas of
fibroglandular density.
FINDINGS: There are no findings suspicious for malignancy.
IMPRESSION: No mammographic evidence of malignancy. A result letter of this
screening mammogram will be mailed directly to the patient.

RECOMMENDATION:
Screening mammogram in one year. (Code:51-O-LD2)

BI-RADS CATEGORY  1: Negative.

## 2022-12-11 ENCOUNTER — Other Ambulatory Visit (HOSPITAL_COMMUNITY): Payer: Self-pay | Admitting: Nephrology

## 2022-12-11 DIAGNOSIS — I129 Hypertensive chronic kidney disease with stage 1 through stage 4 chronic kidney disease, or unspecified chronic kidney disease: Secondary | ICD-10-CM

## 2022-12-11 DIAGNOSIS — E785 Hyperlipidemia, unspecified: Secondary | ICD-10-CM

## 2022-12-11 DIAGNOSIS — N1832 Chronic kidney disease, stage 3b: Secondary | ICD-10-CM

## 2022-12-13 ENCOUNTER — Ambulatory Visit (HOSPITAL_COMMUNITY): Payer: BC Managed Care – PPO

## 2022-12-16 ENCOUNTER — Ambulatory Visit (HOSPITAL_COMMUNITY)
Admission: RE | Admit: 2022-12-16 | Discharge: 2022-12-16 | Disposition: A | Discharge status: Home / Self Care | Payer: BC Managed Care – PPO | Source: Ambulatory Visit | Attending: Nephrology | Admitting: Nephrology

## 2022-12-16 DIAGNOSIS — N1832 Chronic kidney disease, stage 3b: Secondary | ICD-10-CM | POA: Diagnosis present

## 2022-12-16 DIAGNOSIS — I129 Hypertensive chronic kidney disease with stage 1 through stage 4 chronic kidney disease, or unspecified chronic kidney disease: Secondary | ICD-10-CM | POA: Diagnosis present

## 2022-12-16 DIAGNOSIS — E785 Hyperlipidemia, unspecified: Secondary | ICD-10-CM | POA: Insufficient documentation
